# Patient Record
Sex: Female | Born: 1990 | Race: White | Hispanic: No | Marital: Single | Smoking: Former smoker
Health system: Southern US, Community
[De-identification: ages and names within clinical notes are randomized; demographics above are authoritative.]

## PROBLEM LIST (undated history)

## (undated) ENCOUNTER — Inpatient Hospital Stay (HOSPITAL_COMMUNITY): Payer: Self-pay

## (undated) DIAGNOSIS — N83209 Unspecified ovarian cyst, unspecified side: Secondary | ICD-10-CM

## (undated) DIAGNOSIS — O99119 Other diseases of the blood and blood-forming organs and certain disorders involving the immune mechanism complicating pregnancy, unspecified trimester: Secondary | ICD-10-CM

## (undated) DIAGNOSIS — J45909 Unspecified asthma, uncomplicated: Secondary | ICD-10-CM

## (undated) DIAGNOSIS — D696 Thrombocytopenia, unspecified: Secondary | ICD-10-CM

## (undated) DIAGNOSIS — J40 Bronchitis, not specified as acute or chronic: Secondary | ICD-10-CM

## (undated) HISTORY — DX: Other diseases of the blood and blood-forming organs and certain disorders involving the immune mechanism complicating pregnancy, unspecified trimester: O99.119

## (undated) HISTORY — PX: OTHER SURGICAL HISTORY: SHX169

## (undated) HISTORY — DX: Thrombocytopenia, unspecified: D69.6

## (undated) HISTORY — DX: Unspecified asthma, uncomplicated: J45.909

## (undated) HISTORY — PX: APPENDECTOMY: SHX54

## (undated) HISTORY — PX: TONSILLECTOMY AND ADENOIDECTOMY: SUR1326

## (undated) HISTORY — PX: SINUS SURGERY WITH INSTATRAK: SHX5215

---
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
## 2012-01-08 ENCOUNTER — Other Ambulatory Visit: Payer: Self-pay | Admitting: *Deleted

## 2012-01-14 ENCOUNTER — Inpatient Hospital Stay (HOSPITAL_COMMUNITY)
Admission: AD | Admit: 2012-01-14 | Discharge: 2012-01-14 | Disposition: A | Discharge status: Home / Self Care | Payer: Medicaid Other | Source: Ambulatory Visit | Attending: Obstetrics | Admitting: Obstetrics

## 2012-01-14 DIAGNOSIS — Z2989 Encounter for other specified prophylactic measures: Secondary | ICD-10-CM | POA: Insufficient documentation

## 2012-01-14 DIAGNOSIS — Z3480 Encounter for supervision of other normal pregnancy, unspecified trimester: Secondary | ICD-10-CM | POA: Insufficient documentation

## 2012-01-14 DIAGNOSIS — Z298 Encounter for other specified prophylactic measures: Secondary | ICD-10-CM | POA: Insufficient documentation

## 2012-01-14 MED ORDER — RHO D IMMUNE GLOBULIN 1500 UNIT/2ML IJ SOLN
300.0000 ug | Freq: Once | INTRAMUSCULAR | Status: AC
  Administered 2012-01-14: 300 ug via INTRAMUSCULAR
  Filled 2012-01-14: qty 2

## 2012-01-14 NOTE — [facility]U Note (Signed)
Pt presents for rhogam injection at [redacted] weeks pregnant, denies problems

## 2012-01-14 NOTE — MAU Note (Signed)
Call to Dr Clearance Coots for order or rhogam, none in held orders, same obtained.

## 2012-01-15 LAB — RH IG WORKUP (INCLUDES ABO/RH)
Antibody Screen: NEGATIVE
Fetal Screen: NEGATIVE
Unit division: 0

## 2012-02-11 ENCOUNTER — Other Ambulatory Visit: Payer: Self-pay | Admitting: Obstetrics

## 2012-02-11 DIAGNOSIS — D696 Thrombocytopenia, unspecified: Secondary | ICD-10-CM

## 2012-02-11 DIAGNOSIS — O99119 Other diseases of the blood and blood-forming organs and certain disorders involving the immune mechanism complicating pregnancy, unspecified trimester: Secondary | ICD-10-CM

## 2012-02-16 IMAGING — CT CT HEAD W/O CM
1 of 2 series · 16 of 30 positions shown, 20 images · non-contrast
Comparison: None.

CLINICAL DATA: Pain, trauma

CT HEAD WITHOUT CONTRAST
TECHNIQUE: Contiguous axial images were obtained from the base of
the skull through the vertex without contrast.

[Series 3: recon 2: brain · axial · 0.47mm/px · z∈[+145,+280]mm · 16 of 56 slices shown, 20 images]
[im 3/56  brain]
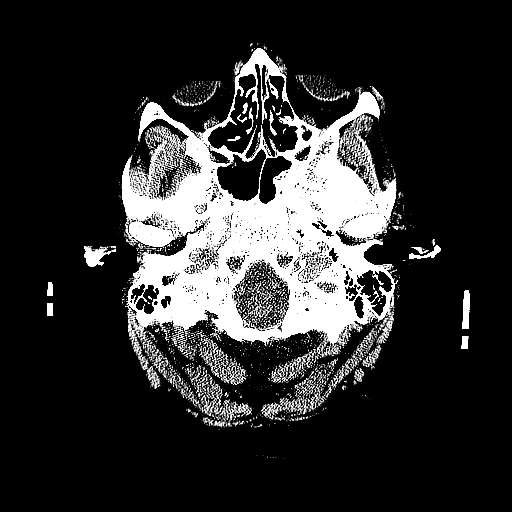
[im 3/56  bone]
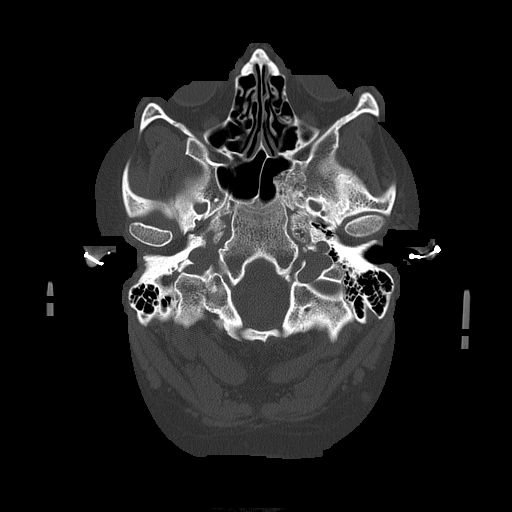
[im 6/56  brain]
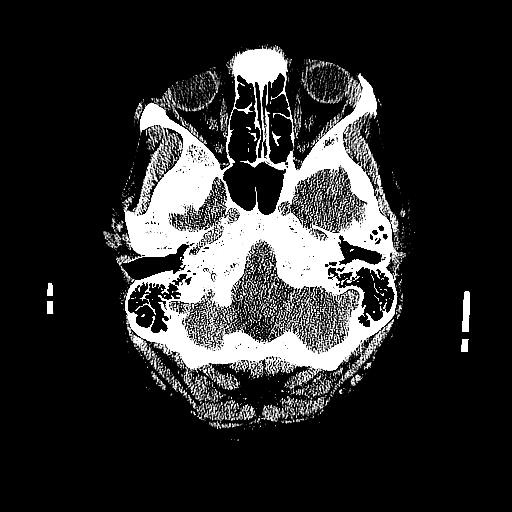
[im 9/56  brain]
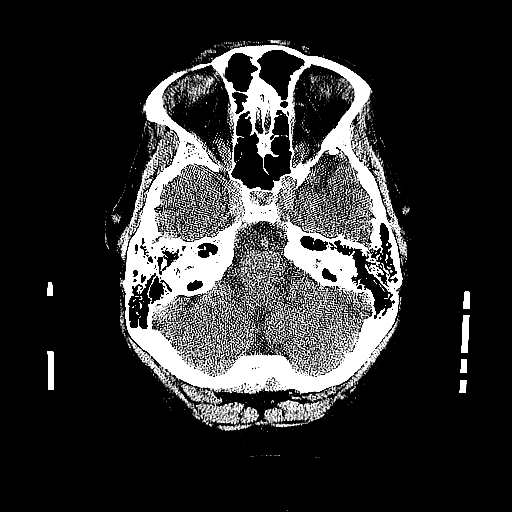
[im 12/56  brain]
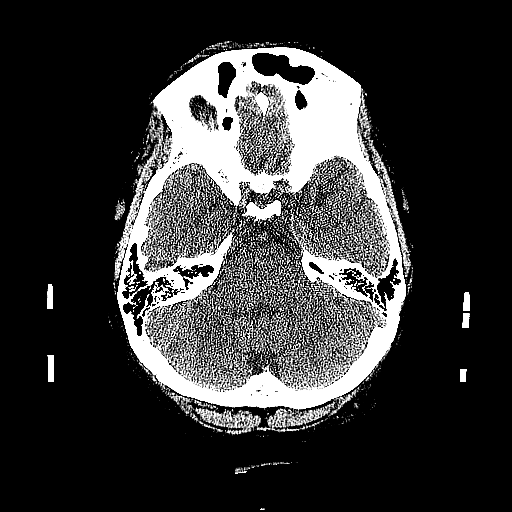
[im 18/56  brain]
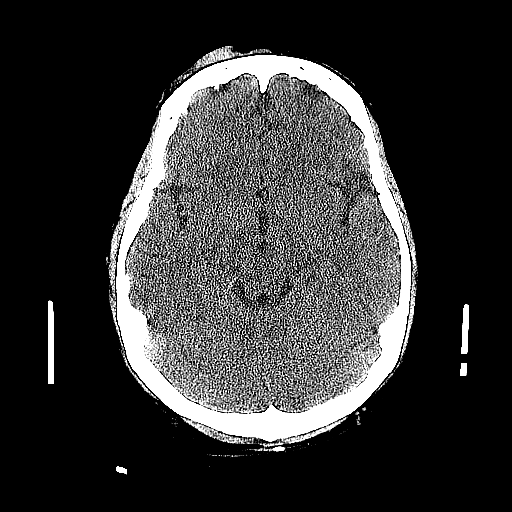
[im 18/56  bone]
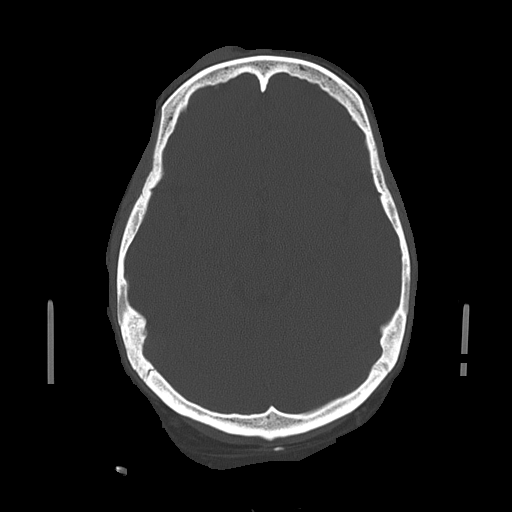
[im 21/56  brain]
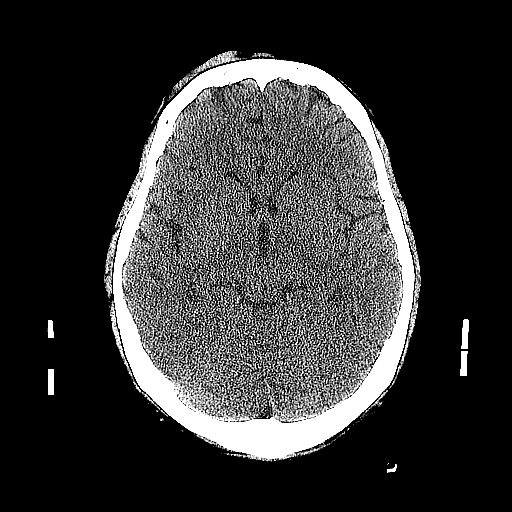
[im 24/56  brain]
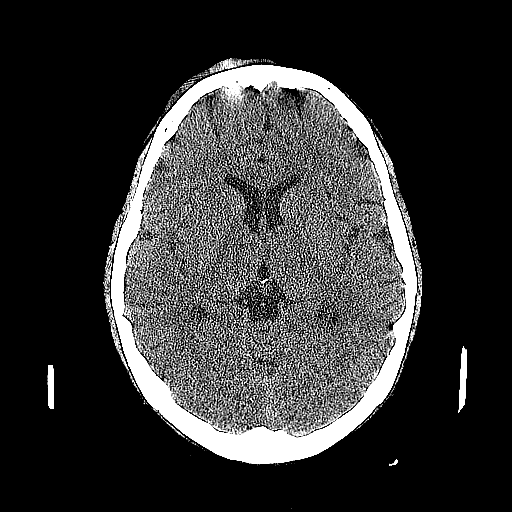
[im 27/56  brain]
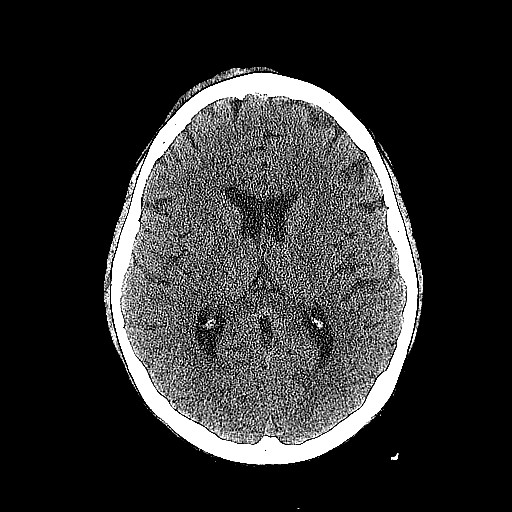
[im 29/56  brain]
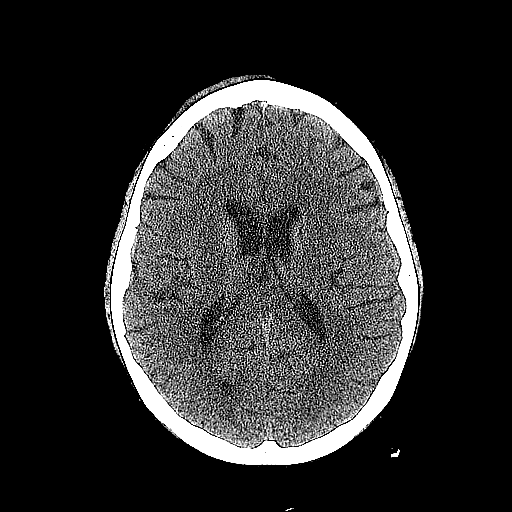
[im 29/56  bone]
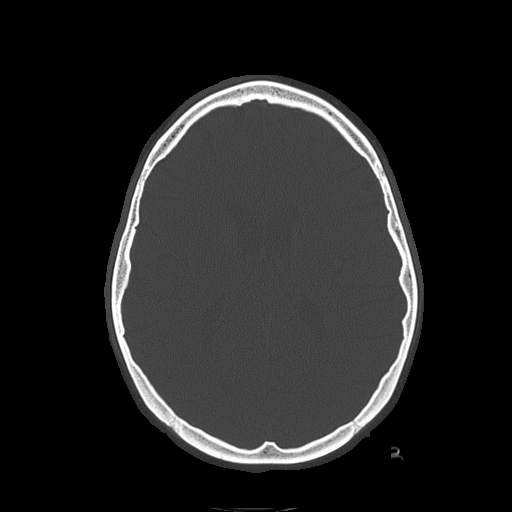
[im 32/56  brain]
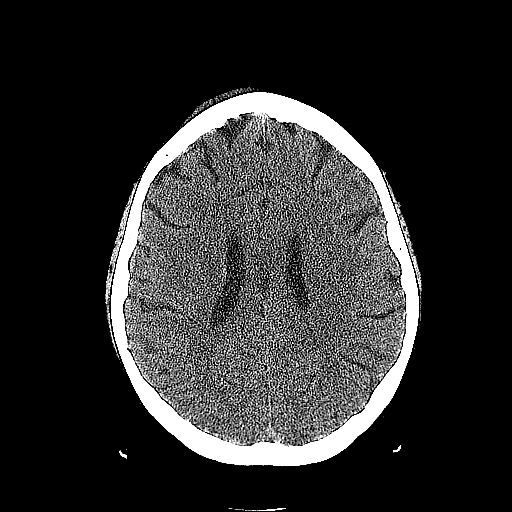
[im 35/56  brain]
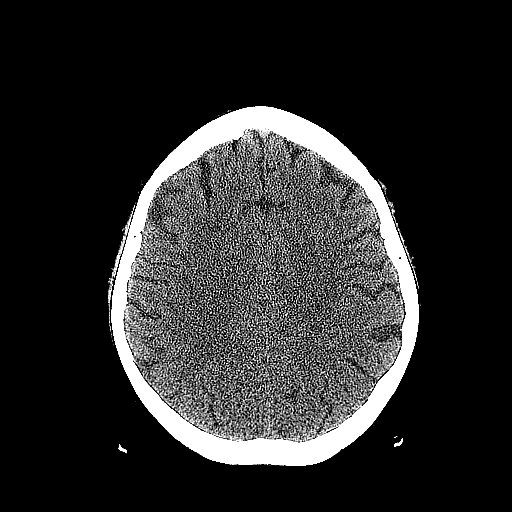
[im 38/56  brain]
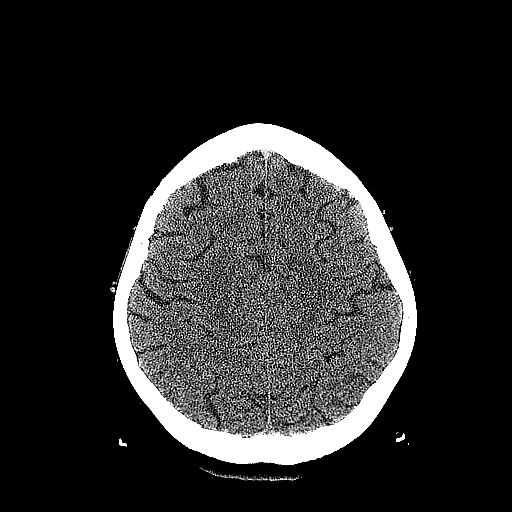
[im 44/56  brain]
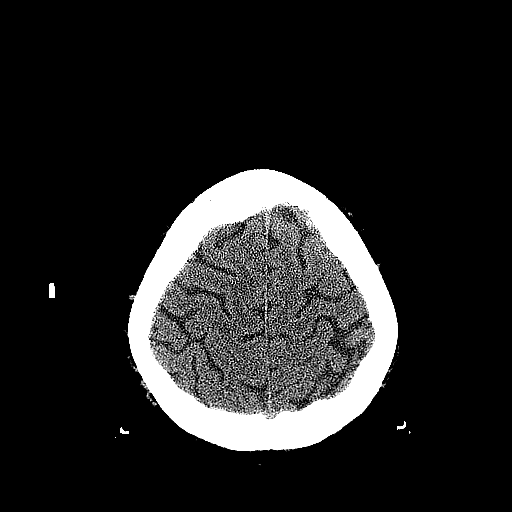
[im 44/56  bone]
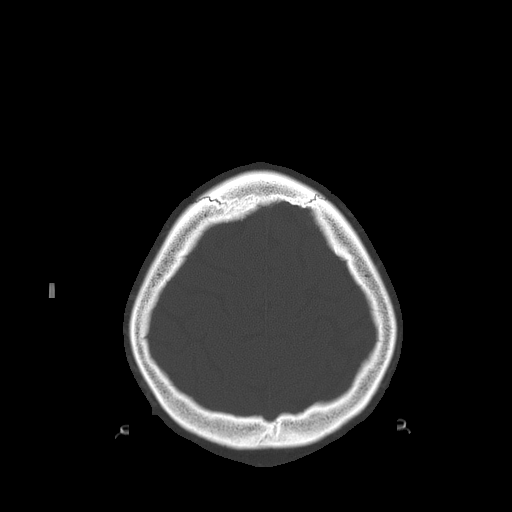
[im 47/56  brain]
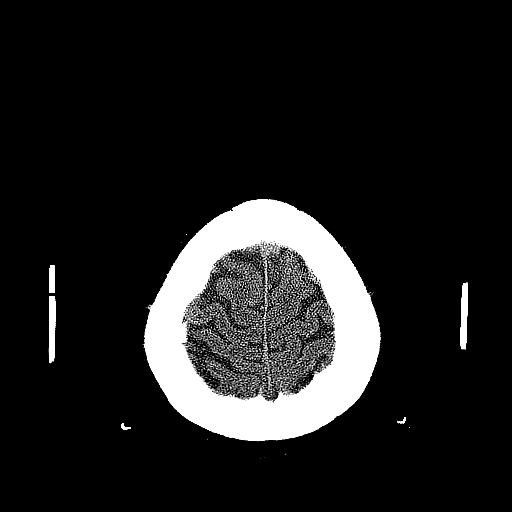
[im 50/56  brain]
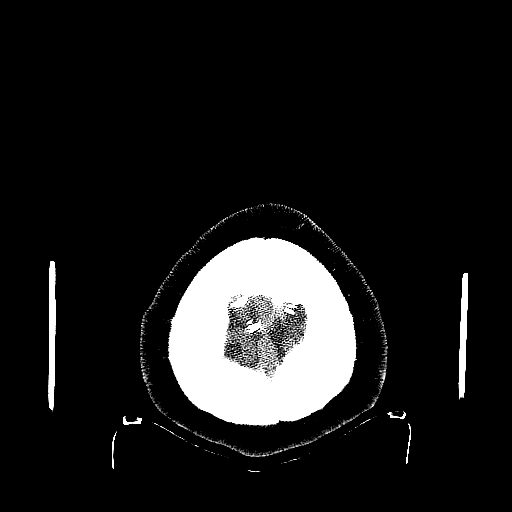
[im 53/56  brain]
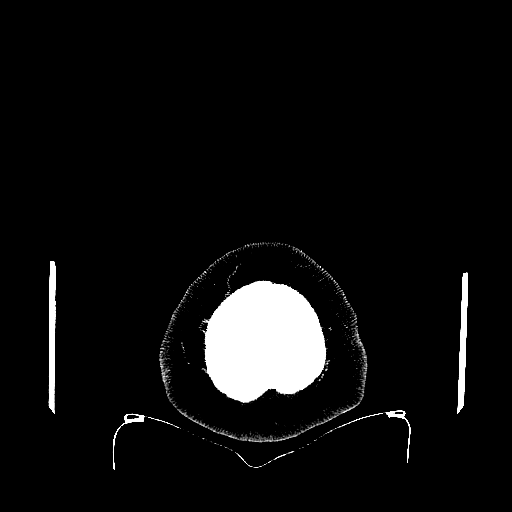

[16 of 30 positions shown; findings below may reference images not displayed]

FINDINGS: There is  right frontal scalp swelling.  No underlying
skull fracture is present.  The orbits and paranasal sinuses are
unremarkable.

The ventricles are normal in size, shape, and position.  There is
no mass effect or midline shift. There is no acute hemorrhage or
abnormal extra-axial fluid collections.  The gray/white
differentiation is normal.
IMPRESSION: There is no evidence of acute intracranial abnormality.

Soft tissue swelling of the inferior right frontal scalp.

## 2012-02-19 ENCOUNTER — Ambulatory Visit (HOSPITAL_COMMUNITY)
Admission: RE | Admit: 2012-02-19 | Discharge: 2012-02-19 | Disposition: A | Discharge status: Home / Self Care | Payer: Medicaid Other | Source: Ambulatory Visit | Attending: Obstetrics | Admitting: Obstetrics

## 2012-02-19 ENCOUNTER — Encounter (HOSPITAL_COMMUNITY): Payer: Self-pay

## 2012-02-19 DIAGNOSIS — D696 Thrombocytopenia, unspecified: Secondary | ICD-10-CM

## 2012-02-19 DIAGNOSIS — O358XX0 Maternal care for other (suspected) fetal abnormality and damage, not applicable or unspecified: Secondary | ICD-10-CM | POA: Insufficient documentation

## 2012-02-19 DIAGNOSIS — Z1389 Encounter for screening for other disorder: Secondary | ICD-10-CM | POA: Insufficient documentation

## 2012-02-19 DIAGNOSIS — Z363 Encounter for antenatal screening for malformations: Secondary | ICD-10-CM | POA: Insufficient documentation

## 2012-02-19 DIAGNOSIS — D689 Coagulation defect, unspecified: Secondary | ICD-10-CM | POA: Insufficient documentation

## 2012-02-21 NOTE — Progress Notes (Signed)
Sylvia Chang is a 21 year old G1 Hispanic female at 33+ weeks who presents for consultation for thrombocytopenia. Sylvia Chang reports no prenatal complications until now except for some spotting in the first trimester. Blood work from 07/15 revealed her platelets to be 92K. She denies any history of low platelets, easy bruising, petechia or bleeding gums.   Ultrasound today: EFW at the 44th %tile, normal AFV and no gross abnormalities identified  Medical history: none Surgical history: ear tubes, T&A and appendectomy Medications: PNVs and "heart burn" pills prn Allergies: none Social: works at [employer]; denies cig/ETOH/drug abuse Family history: noncontributory for congenital anomalies or inheritable disorders  Assessment: 1) IUP at 33+3 weeks 2) Most likely gestational thrombocytopenia - she has no concurrent medical disorders and is not taking any medications known to cause thrombocytopenia  The implications of a low platelet count in pregnancy were discussed in detail. We reviewed the differences between gestational thrombocytopenia and immune thrombocytopenia including neonatal concerns. The most important issue at this point in the pregnancy is for her to have an option for regional anesthesia; therefore, her platelets need to be > 100K.  Recommendations: 1) Serial platelet counts - she will return on 08/05 for a repeat platelet count 2) If her count is < 100K at 37-38 weeks, will try a short course of prednisone  3) If her count is < 70K, will consider further work-up for possible immune thrombocytopenia  It was a pleasure meeting Sylvia Chang and looking forward to seeing her again.  (Face-to-face consultation with patient: 30 min)

## 2012-03-02 ENCOUNTER — Ambulatory Visit (HOSPITAL_COMMUNITY)
Admission: RE | Admit: 2012-03-02 | Discharge: 2012-03-02 | Disposition: A | Discharge status: Home / Self Care | Payer: Medicaid Other | Source: Ambulatory Visit | Attending: Obstetrics | Admitting: Obstetrics

## 2012-03-02 DIAGNOSIS — D689 Coagulation defect, unspecified: Secondary | ICD-10-CM | POA: Insufficient documentation

## 2012-03-02 DIAGNOSIS — D696 Thrombocytopenia, unspecified: Secondary | ICD-10-CM | POA: Insufficient documentation

## 2012-04-09 ENCOUNTER — Other Ambulatory Visit: Payer: Self-pay | Admitting: Obstetrics

## 2012-04-10 ENCOUNTER — Encounter (HOSPITAL_COMMUNITY): Payer: Self-pay | Admitting: *Deleted

## 2012-04-10 ENCOUNTER — Telehealth (HOSPITAL_COMMUNITY): Payer: Self-pay | Admitting: *Deleted

## 2012-04-10 NOTE — Telephone Encounter (Signed)
Preadmission screen  

## 2012-04-12 ENCOUNTER — Encounter (HOSPITAL_COMMUNITY): Payer: Self-pay | Admitting: Anesthesiology

## 2012-04-12 ENCOUNTER — Inpatient Hospital Stay (HOSPITAL_COMMUNITY): Payer: Medicaid Other | Admitting: Anesthesiology

## 2012-04-12 ENCOUNTER — Encounter (HOSPITAL_COMMUNITY): Payer: Self-pay

## 2012-04-12 ENCOUNTER — Inpatient Hospital Stay (HOSPITAL_COMMUNITY)
Admission: RE | Admit: 2012-04-12 | Discharge: 2012-04-15 | DRG: 775 | Disposition: A | Discharge status: Home / Self Care | Payer: Medicaid Other | Source: Ambulatory Visit | Attending: Obstetrics | Admitting: Obstetrics

## 2012-04-12 DIAGNOSIS — O9903 Anemia complicating the puerperium: Secondary | ICD-10-CM | POA: Diagnosis not present

## 2012-04-12 DIAGNOSIS — Z22330 Carrier of Group B streptococcus: Secondary | ICD-10-CM

## 2012-04-12 DIAGNOSIS — D649 Anemia, unspecified: Secondary | ICD-10-CM | POA: Diagnosis not present

## 2012-04-12 DIAGNOSIS — O99892 Other specified diseases and conditions complicating childbirth: Secondary | ICD-10-CM | POA: Diagnosis present

## 2012-04-12 DIAGNOSIS — O480 Post-term pregnancy: Principal | ICD-10-CM | POA: Diagnosis present

## 2012-04-12 DIAGNOSIS — IMO0001 Reserved for inherently not codable concepts without codable children: Secondary | ICD-10-CM

## 2012-04-12 DIAGNOSIS — D696 Thrombocytopenia, unspecified: Secondary | ICD-10-CM | POA: Diagnosis present

## 2012-04-12 DIAGNOSIS — D689 Coagulation defect, unspecified: Secondary | ICD-10-CM | POA: Diagnosis present

## 2012-04-12 LAB — CBC
Hemoglobin: 12.9 g/dL (ref 12.0–15.0)
MCH: 29.5 pg (ref 26.0–34.0)
MCHC: 33.8 g/dL (ref 30.0–36.0)
MCV: 87.3 fL (ref 78.0–100.0)
Platelets: 97 10*3/uL — ABNORMAL LOW (ref 150–400)
Platelets: 108 10*3/uL — ABNORMAL LOW (ref 150–400)
RBC: 4.41 MIL/uL (ref 3.87–5.11)
RBC: 4.36 MIL/uL (ref 3.87–5.11)
WBC: 10.8 10*3/uL — ABNORMAL HIGH (ref 4.0–10.5)

## 2012-04-12 MED ORDER — LACTATED RINGERS IV SOLN
500.0000 mL | Freq: Once | INTRAVENOUS | Status: AC
  Administered 2012-04-13: 500 mL via INTRAVENOUS

## 2012-04-12 MED ORDER — DIPHENHYDRAMINE HCL 50 MG/ML IJ SOLN: 12.5000 mg | INTRAMUSCULAR | Status: DC | PRN

## 2012-04-12 MED ORDER — PHENYLEPHRINE 40 MCG/ML (10ML) SYRINGE FOR IV PUSH (FOR BLOOD PRESSURE SUPPORT): 80.0000 ug | PREFILLED_SYRINGE | INTRAVENOUS | Status: DC | PRN

## 2012-04-12 MED ORDER — OXYTOCIN 40 UNITS IN LACTATED RINGERS INFUSION - SIMPLE MED
1.0000 m[IU]/min | INTRAVENOUS | Status: DC
  Administered 2012-04-12: 1 m[IU]/min via INTRAVENOUS

## 2012-04-12 MED ORDER — IBUPROFEN 600 MG PO TABS: 600.0000 mg | ORAL_TABLET | Freq: Four times a day (QID) | ORAL | Status: DC | PRN

## 2012-04-12 MED ORDER — EPHEDRINE 5 MG/ML INJ
10.0000 mg | INTRAVENOUS | Status: AC | PRN
  Administered 2012-04-13 (×2): 10 mg via INTRAVENOUS
  Filled 2012-04-12 (×2): qty 4

## 2012-04-12 MED ORDER — OXYTOCIN 40 UNITS IN LACTATED RINGERS INFUSION - SIMPLE MED
1.0000 m[IU]/min | INTRAVENOUS | Status: DC
  Filled 2012-04-12: qty 1000

## 2012-04-12 MED ORDER — LACTATED RINGERS IV SOLN
INTRAVENOUS | Status: DC
  Administered 2012-04-12 (×3): via INTRAVENOUS

## 2012-04-12 MED ORDER — PROMETHAZINE HCL 25 MG/ML IJ SOLN
25.0000 mg | Freq: Four times a day (QID) | INTRAMUSCULAR | Status: DC | PRN
  Administered 2012-04-12: 25 mg via INTRAMUSCULAR
  Filled 2012-04-12: qty 1

## 2012-04-12 MED ORDER — LIDOCAINE HCL (PF) 1 % IJ SOLN
30.0000 mL | INTRAMUSCULAR | Status: AC | PRN
  Administered 2012-04-13: 30 mL via SUBCUTANEOUS
  Filled 2012-04-12: qty 30

## 2012-04-12 MED ORDER — OXYCODONE-ACETAMINOPHEN 5-325 MG PO TABS: 1.0000 | ORAL_TABLET | ORAL | Status: DC | PRN

## 2012-04-12 MED ORDER — NALBUPHINE SYRINGE 5 MG/0.5 ML
10.0000 mg | INJECTION | Freq: Four times a day (QID) | INTRAMUSCULAR | Status: DC | PRN
  Administered 2012-04-12: 10 mg via INTRAMUSCULAR
  Filled 2012-04-12 (×2): qty 1

## 2012-04-12 MED ORDER — CITRIC ACID-SODIUM CITRATE 334-500 MG/5ML PO SOLN: 30.0000 mL | ORAL | Status: DC | PRN

## 2012-04-12 MED ORDER — TERBUTALINE SULFATE 1 MG/ML IJ SOLN: 0.2500 mg | Freq: Once | INTRAMUSCULAR | Status: AC | PRN

## 2012-04-12 MED ORDER — LACTATED RINGERS IV SOLN: 500.0000 mL | INTRAVENOUS | Status: DC | PRN

## 2012-04-12 MED ORDER — OXYTOCIN BOLUS FROM INFUSION
500.0000 mL | Freq: Once | INTRAVENOUS | Status: DC
  Filled 2012-04-12: qty 500

## 2012-04-12 MED ORDER — PENICILLIN G POTASSIUM 5000000 UNITS IJ SOLR
5.0000 10*6.[IU] | Freq: Once | INTRAVENOUS | Status: AC
  Administered 2012-04-12: 5 10*6.[IU] via INTRAVENOUS
  Filled 2012-04-12: qty 5

## 2012-04-12 MED ORDER — ACETAMINOPHEN 325 MG PO TABS: 650.0000 mg | ORAL_TABLET | ORAL | Status: DC | PRN

## 2012-04-12 MED ORDER — PENICILLIN G POTASSIUM 5000000 UNITS IJ SOLR
2.5000 10*6.[IU] | INTRAVENOUS | Status: DC
  Administered 2012-04-12 – 2012-04-13 (×5): 2.5 10*6.[IU] via INTRAVENOUS
  Filled 2012-04-12 (×8): qty 2.5

## 2012-04-12 MED ORDER — NALBUPHINE SYRINGE 5 MG/0.5 ML
10.0000 mg | INJECTION | INTRAMUSCULAR | Status: DC | PRN
  Administered 2012-04-12 (×2): 10 mg via INTRAVENOUS
  Filled 2012-04-12 (×3): qty 1

## 2012-04-12 MED ORDER — PHENYLEPHRINE 40 MCG/ML (10ML) SYRINGE FOR IV PUSH (FOR BLOOD PRESSURE SUPPORT)
80.0000 ug | PREFILLED_SYRINGE | INTRAVENOUS | Status: DC | PRN
  Filled 2012-04-12: qty 5

## 2012-04-12 MED ORDER — OXYTOCIN 40 UNITS IN LACTATED RINGERS INFUSION - SIMPLE MED
62.5000 mL/h | Freq: Once | INTRAVENOUS | Status: AC
  Administered 2012-04-13: 62.5 mL/h via INTRAVENOUS

## 2012-04-12 MED ORDER — ONDANSETRON HCL 4 MG/2ML IJ SOLN: 4.0000 mg | Freq: Four times a day (QID) | INTRAMUSCULAR | Status: DC | PRN

## 2012-04-12 MED ORDER — FENTANYL 2.5 MCG/ML BUPIVACAINE 1/10 % EPIDURAL INFUSION (WH - ANES)
14.0000 mL/h | INTRAMUSCULAR | Status: DC
  Administered 2012-04-12: 14 mL/h via EPIDURAL
  Administered 2012-04-12: 5 mL/h via EPIDURAL
  Administered 2012-04-12 – 2012-04-13 (×4): 14 mL/h via EPIDURAL
  Filled 2012-04-12 (×5): qty 60

## 2012-04-12 MED ORDER — OXYTOCIN 40 UNITS IN LACTATED RINGERS INFUSION - SIMPLE MED
INTRAVENOUS | Status: AC
  Administered 2012-04-12: 1 m[IU]/min via INTRAVENOUS
  Filled 2012-04-12: qty 1000

## 2012-04-12 MED ORDER — EPHEDRINE 5 MG/ML INJ: 10.0000 mg | INTRAVENOUS | Status: DC | PRN

## 2012-04-12 NOTE — Progress Notes (Signed)
Sylvia Chang is a 21 y.o. G1P0 at [redacted]w[redacted]d by LMP admitted for induction of labor due to Post dates. Due date 04-05-12.  Subjective:   Objective: BP 123/71  Pulse 90  Temp 98.6 F (37 C) (Oral)  Resp 18  Ht 5\' 3"  (1.6 m)  Wt 127.461 kg (281 lb)  BMI 49.78 kg/m2  LMP 06/19/2011      FHT:  FHR: 150 bpm, variability: moderate,  accelerations:  Present,  decelerations:  Absent UC:   irregular, every 4-7 minutes SVE:   Dilation: 4 Effacement (%): 70 Station: -2 Exam by:: Rzhang,rnc-ob  Labs: Lab Results  Component Value Date   WBC 9.6 04/12/2012   HGB 13.0 04/12/2012   HCT 38.5 04/12/2012   MCV 87.3 04/12/2012   PLT 97* 04/12/2012    Assessment / Plan: Induction of labor due to postterm,  progressing well on pitocin  Labor: Latent phase Preeclampsia:  n/a Fetal Wellbeing:  Category I Pain Control:  Labor support without medications I/D:  n/a Anticipated MOD:  NSVD  Ark Agrusa A 04/12/2012, 9:24 AM

## 2012-04-12 NOTE — Progress Notes (Signed)
0830 DR HARPER CALLED AND UPDATED ON PT ARRIVED FOR IOL, SVE, LAST 2 BP'S TAKEN, GBS +. ORDERS RECEIVED. NOTIFIED FHR NOT REACTIVE INITIALLY, NOW REACTIVE.

## 2012-04-12 NOTE — Anesthesia Preprocedure Evaluation (Signed)
Anesthesia Evaluation  Patient identified by MRN, date of birth, ID band Patient awake    Reviewed: Allergy & Precautions, H&P , Patient's Chart, lab work & pertinent test results  Airway Mallampati: IV TM Distance: >3 FB Neck ROM: full    Dental No notable dental hx.    Pulmonary neg pulmonary ROS, asthma ,  breath sounds clear to auscultation  Pulmonary exam normal       Cardiovascular hypertension, negative cardio ROS  Rhythm:regular Rate:Normal     Neuro/Psych negative neurological ROS  negative psych ROS   GI/Hepatic negative GI ROS, Neg liver ROS,   Endo/Other  negative endocrine ROSMorbid obesity  Renal/GU negative Renal ROS     Musculoskeletal   Abdominal   Peds  Hematology negative hematology ROS (+)   Anesthesia Other Findings   Reproductive/Obstetrics (+) Pregnancy                           Anesthesia Physical Anesthesia Plan  ASA: III  Anesthesia Plan: Epidural   Post-op Pain Management:    Induction:   Airway Management Planned:   Additional Equipment:   Intra-op Plan:   Post-operative Plan:   Informed Consent: I have reviewed the patients History and Physical, chart, labs and discussed the procedure including the risks, benefits and alternatives for the proposed anesthesia with the patient or authorized representative who has indicated his/her understanding and acceptance.     Plan Discussed with:   Anesthesia Plan Comments:         Anesthesia Quick Evaluation

## 2012-04-12 NOTE — Anesthesia Procedure Notes (Signed)
Epidural Patient location during procedure: OB Start time: 04/12/2012 7:47 PM  Staffing Anesthesiologist: Brayton Caves R Performed by: anesthesiologist   Preanesthetic Checklist Completed: patient identified, site marked, surgical consent, pre-op evaluation, timeout performed, IV checked, risks and benefits discussed and monitors and equipment checked  Epidural Patient position: sitting Prep: site prepped and draped and DuraPrep Patient monitoring: continuous pulse ox and blood pressure Approach: midline Injection technique: LOR air and LOR saline  Needle:  Needle type: Tuohy  Needle gauge: 17 G Needle length: 9 cm and 9 Needle insertion depth: 7 cm Catheter type: closed end flexible Catheter size: 19 Gauge Catheter at skin depth: 14 cm Test dose: negative  Assessment Events: blood not aspirated, injection not painful, no injection resistance, negative IV test and no paresthesia  Additional Notes Patient identified.  Risk benefits discussed including failed block, incomplete pain control, headache, nerve damage, paralysis, blood pressure changes, nausea, vomiting, reactions to medication both toxic or allergic, and postpartum back pain.  Patient expressed understanding and wished to proceed.  All questions were answered.  Sterile technique used throughout procedure and epidural site dressed with sterile barrier dressing. No paresthesia or other complications noted.The patient did not experience any signs of intravascular injection such as tinnitus or metallic taste in mouth nor signs of intrathecal spread such as rapid motor block. Please see nursing notes for vital signs.

## 2012-04-12 NOTE — H&P (Signed)
Sylvia Chang is a 21 y.o. female presenting for IOL. Maternal Medical History:  Reason for admission: 21 yo G1  EDC 04-05-12.  Presents for IOL for postdates.  Pregnancy complicated by thrombocytopenia.  Fetal activity: Perceived fetal activity is normal.   Last perceived fetal movement was within the past hour.    Prenatal complications: Thrombocytopenia.   Prenatal Complications - Diabetes: none.    OB History    Grav Para Term Preterm Abortions TAB SAB Ect Mult Living   1              Past Medical History  Diagnosis Date  . Bronchitis   . Ovarian cyst   . Asthma   . Thrombocytopenia complicating pregnancy    Past Surgical History  Procedure Date  . Sinus surgery with instatrak   . Tonsillectomy and adenoidectomy   . Tubes in ears   . Appendectomy    Family History: family history includes Diabetes in her father, paternal grandfather, and paternal uncle; Heart disease in her mother; and Hypertension in her mother. Social History:  reports that she quit smoking about 9 months ago. Her smoking use included Cigarettes. She has never used smokeless tobacco. She reports that she uses illicit drugs (Marijuana). She reports that she does not drink alcohol.   Prenatal Transfer Tool  Maternal Diabetes: No Genetic Screening: Normal Maternal Ultrasounds/Referrals: Normal Fetal Ultrasounds or other Referrals:  None Maternal Substance Abuse:  No Significant Maternal Medications:  Meds include: Other:  Significant Maternal Lab Results:  Lab values include: Group B Strep positive Other Comments:  None  Review of Systems  All other systems reviewed and are negative.    Dilation: 4 Effacement (%): 70 Station: -2 Exam by:: Rzhang,rnc-ob Blood pressure 123/71, pulse 90, temperature 98.6 F (37 C), temperature source Oral, resp. rate 18, height 5\' 3"  (1.6 m), weight 127.461 kg (281 lb), last menstrual period 06/19/2011. Maternal Exam:  Uterine Assessment: Contraction  strength is mild.  Abdomen: Patient reports no abdominal tenderness. Fetal presentation: vertex  Introitus: Normal vulva. Normal vagina.  Pelvis: adequate for delivery.   Cervix: Cervix evaluated by digital exam.     Physical Exam  Nursing note and vitals reviewed. Constitutional: She is oriented to person, place, and time. She appears well-developed and well-nourished.  HENT:  Head: Normocephalic and atraumatic.  Eyes: Conjunctivae normal are normal. Pupils are equal, round, and reactive to light.  Neck: Normal range of motion. Neck supple.  Cardiovascular: Normal rate and regular rhythm.   Respiratory: Effort normal and breath sounds normal.  GI: Soft.  Genitourinary: Vagina normal and uterus normal.  Musculoskeletal: Normal range of motion.  Neurological: She is alert and oriented to person, place, and time.  Skin: Skin is warm and dry.  Psychiatric: She has a normal mood and affect. Her behavior is normal. Judgment and thought content normal.    Prenatal labs: ABO, Rh: --/--/A NEG (06/18 2118) Antibody: NEG (06/18 2118) Rubella: Immune (03/12 0000) RPR: Nonreactive (03/12 0000)  HBsAg: NEGATIVE (11/21 1253)  HIV: Non-reactive (03/12 0000)  GBS: Positive (08/15 0000)   Assessment/Plan: 41 weeks.  IOL.  H/O Gestational Thrombocytopenia.  Low dose pitocin per protocol.   Sylvia Chang A 04/12/2012, 9:12 AM

## 2012-04-12 NOTE — Progress Notes (Signed)
Based on SVE-pt wants to try to walk before getting iv pain med.

## 2012-04-12 NOTE — Plan of Care (Signed)
Problem: Consults Goal: Birthing Suites Patient Information Press F2 to bring up selections list  Outcome: Completed/Met Date Met:  04/12/12  Pt > [redacted] weeks EGA and Inpatient induction

## 2012-04-13 ENCOUNTER — Other Ambulatory Visit: Payer: Self-pay

## 2012-04-13 ENCOUNTER — Inpatient Hospital Stay (HOSPITAL_COMMUNITY): Payer: Medicaid Other

## 2012-04-13 ENCOUNTER — Encounter (HOSPITAL_COMMUNITY): Payer: Self-pay

## 2012-04-13 DIAGNOSIS — IMO0001 Reserved for inherently not codable concepts without codable children: Secondary | ICD-10-CM

## 2012-04-13 DIAGNOSIS — D696 Thrombocytopenia, unspecified: Secondary | ICD-10-CM | POA: Diagnosis present

## 2012-04-13 DIAGNOSIS — O480 Post-term pregnancy: Secondary | ICD-10-CM | POA: Diagnosis present

## 2012-04-13 LAB — CBC
Hemoglobin: 9.9 g/dL — ABNORMAL LOW (ref 12.0–15.0)
MCH: 30.5 pg (ref 26.0–34.0)
MCV: 89.2 fL (ref 78.0–100.0)
Platelets: 119 10*3/uL — ABNORMAL LOW (ref 150–400)
RBC: 3.25 MIL/uL — ABNORMAL LOW (ref 3.87–5.11)
WBC: 16.1 10*3/uL — ABNORMAL HIGH (ref 4.0–10.5)

## 2012-04-13 MED ORDER — SENNOSIDES-DOCUSATE SODIUM 8.6-50 MG PO TABS
2.0000 | ORAL_TABLET | Freq: Every day | ORAL | Status: DC
  Administered 2012-04-13 – 2012-04-14 (×2): 2 via ORAL

## 2012-04-13 MED ORDER — MEASLES, MUMPS & RUBELLA VAC ~~LOC~~ INJ: 0.5000 mL | INJECTION | Freq: Once | SUBCUTANEOUS | Status: DC

## 2012-04-13 MED ORDER — DIPHENHYDRAMINE HCL 25 MG PO CAPS: 25.0000 mg | ORAL_CAPSULE | Freq: Four times a day (QID) | ORAL | Status: DC | PRN

## 2012-04-13 MED ORDER — ONDANSETRON HCL 4 MG PO TABS: 4.0000 mg | ORAL_TABLET | ORAL | Status: DC | PRN

## 2012-04-13 MED ORDER — OXYCODONE-ACETAMINOPHEN 5-325 MG PO TABS
1.0000 | ORAL_TABLET | ORAL | Status: DC | PRN
  Administered 2012-04-13 (×2): 1 via ORAL
  Administered 2012-04-14 (×5): 2 via ORAL
  Administered 2012-04-15: 1 via ORAL
  Administered 2012-04-15 (×2): 2 via ORAL
  Filled 2012-04-13 (×3): qty 2
  Filled 2012-04-13 (×3): qty 1
  Filled 2012-04-13: qty 2
  Filled 2012-04-13: qty 1
  Filled 2012-04-13: qty 2
  Filled 2012-04-13: qty 1
  Filled 2012-04-13: qty 2

## 2012-04-13 MED ORDER — BENZOCAINE-MENTHOL 20-0.5 % EX AERO
1.0000 "application " | INHALATION_SPRAY | CUTANEOUS | Status: DC | PRN
  Administered 2012-04-14: 1 via TOPICAL
  Filled 2012-04-13: qty 56

## 2012-04-13 MED ORDER — LANOLIN HYDROUS EX OINT: TOPICAL_OINTMENT | CUTANEOUS | Status: DC | PRN

## 2012-04-13 MED ORDER — PRENATAL MULTIVITAMIN CH
1.0000 | ORAL_TABLET | Freq: Every day | ORAL | Status: DC
  Administered 2012-04-13 – 2012-04-15 (×3): 1 via ORAL
  Filled 2012-04-13 (×3): qty 1

## 2012-04-13 MED ORDER — ONDANSETRON HCL 4 MG/2ML IJ SOLN: 4.0000 mg | INTRAMUSCULAR | Status: DC | PRN

## 2012-04-13 MED ORDER — WITCH HAZEL-GLYCERIN EX PADS: 1.0000 "application " | MEDICATED_PAD | CUTANEOUS | Status: DC | PRN

## 2012-04-13 MED ORDER — FERROUS SULFATE 325 (65 FE) MG PO TABS
325.0000 mg | ORAL_TABLET | Freq: Two times a day (BID) | ORAL | Status: DC
  Administered 2012-04-13 – 2012-04-15 (×4): 325 mg via ORAL
  Filled 2012-04-13 (×4): qty 1

## 2012-04-13 MED ORDER — BUPIVACAINE HCL (PF) 0.25 % IJ SOLN
INTRAMUSCULAR | Status: DC | PRN
  Administered 2012-04-13 (×2): 5 mL

## 2012-04-13 MED ORDER — MEDROXYPROGESTERONE ACETATE 150 MG/ML IM SUSP: 150.0000 mg | INTRAMUSCULAR | Status: DC | PRN

## 2012-04-13 MED ORDER — TETANUS-DIPHTH-ACELL PERTUSSIS 5-2.5-18.5 LF-MCG/0.5 IM SUSP: 0.5000 mL | Freq: Once | INTRAMUSCULAR | Status: DC

## 2012-04-13 MED ORDER — DIBUCAINE 1 % RE OINT: 1.0000 "application " | TOPICAL_OINTMENT | RECTAL | Status: DC | PRN

## 2012-04-13 MED ORDER — ZOLPIDEM TARTRATE 5 MG PO TABS: 5.0000 mg | ORAL_TABLET | Freq: Every evening | ORAL | Status: DC | PRN

## 2012-04-13 MED ORDER — INFLUENZA VIRUS VACC SPLIT PF IM SUSP
0.5000 mL | INTRAMUSCULAR | Status: AC
  Administered 2012-04-14: 0.5 mL via INTRAMUSCULAR
  Filled 2012-04-13: qty 0.5

## 2012-04-13 MED ORDER — MAGNESIUM HYDROXIDE 400 MG/5ML PO SUSP: 30.0000 mL | ORAL | Status: DC | PRN

## 2012-04-13 NOTE — Progress Notes (Signed)
Assessed by [facility] and K.Jerrell Mylar

## 2012-04-13 NOTE — Progress Notes (Signed)
Dr. Malen Gauze notified of pt's blood pressure, pulse, interventions, gave orders for additional fluid bolus, repeat cbc, leave epidural cath in at this time.

## 2012-04-13 NOTE — Progress Notes (Signed)
RT at bedside, EKG in progress

## 2012-04-13 NOTE — Progress Notes (Signed)
Pt transferred to room 114, holding baby, via stretcher, pt states, "i feel fine", has no pain, IV- LR infusing.

## 2012-04-13 NOTE — Anesthesia Postprocedure Evaluation (Signed)
  Anesthesia Post-op Note  Patient: Sylvia Chang  Procedure(s) Performed: * No surgery found *  Patient Location: Mother/Baby  Anesthesia Type: Epidural  Level of Consciousness: awake  Airway and Oxygen Therapy: Patient Spontanous Breathing  Post-op Pain: none  Post-op Assessment: Patient's Cardiovascular Status Stable, Respiratory Function Stable, Patent Airway, No signs of Nausea or vomiting, Adequate PO intake, Pain level controlled, No headache, No backache, No residual numbness and No residual motor weakness  Post-op Vital Signs: Reviewed and stable  Complications: No apparent anesthesia complications

## 2012-04-13 NOTE — Progress Notes (Signed)
Chest xray results called, may transfer pt to room 114.

## 2012-04-13 NOTE — Progress Notes (Signed)
Notified of pt's blood pressure, cbc results, received orders for EKG, CBG, will continue recovery here.

## 2012-04-13 NOTE — Progress Notes (Signed)
JULIEANNA GERACI is a 21 y.o. G1P0 at [redacted]w[redacted]d by LMP admitted for induction of labor due to Post dates. Due date 04-05-12.  Subjective:   Objective: BP 105/59  Pulse 70  Temp 98.4 F (36.9 C) (Oral)  Resp 20  Ht 5\' 3"  (1.6 m)  Wt 127.461 kg (281 lb)  BMI 49.78 kg/m2  SpO2 99%  LMP 06/19/2011      FHT:  FHR: 150 bpm, variability: moderate,  accelerations:  Present,  decelerations:  Absent UC:   regular, every 3 minutes SVE:   Dilation: 9 Effacement (%): 100 Station: -1 Exam by:: [employer] RN  Labs: Lab Results  Component Value Date   WBC 10.8* 04/12/2012   HGB 12.9 04/12/2012   HCT 38.3 04/12/2012   MCV 87.8 04/12/2012   PLT 108* 04/12/2012    Assessment / Plan: Induction of labor due to postterm,  progressing well on pitocin  Labor: Progressing on Pitocin, will continue to increase then AROM Preeclampsia:  n/a Fetal Wellbeing:  Category I Pain Control:  Epidural I/D:  n/a Anticipated MOD:  NSVD  HARPER,CHARLES A 04/13/2012, 3:30 AM

## 2012-04-13 NOTE — Progress Notes (Signed)
UR chart review completed.  

## 2012-04-13 NOTE — Progress Notes (Signed)
Sylvia Chang is Chang 21 y.o. G1P0 at [redacted]w[redacted]d by LMP admitted for induction of labor due to Post dates.   Subjective: C/O pressure  Objective: BP 142/77  Pulse 85  Temp 98.7 F (37.1 C) (Oral)  Resp 20  Ht 5\' 3"  (1.6 m)  Wt 127.461 kg (281 lb)  BMI 49.78 kg/m2  SpO2 99%  LMP 06/19/2011      FHT:  FHR: 140 bpm, variability: moderate,  accelerations:  Abscent,  decelerations:  Absent UC:   regular, every 2 minutes SVE:   Dilation: 10 Effacement (%): 100 Station: +2 Exam by:: felkel,rn  Labs: Lab Results  Component Value Date   WBC 10.8* 04/12/2012   HGB 12.9 04/12/2012   HCT 38.3 04/12/2012   MCV 87.8 04/12/2012   PLT 108* 04/12/2012    Assessment / Plan: Induction of labor due to postterm,  progressing well on pitocin  Labor: Progressing normally Preeclampsia:  B/Ps borderline Fetal Wellbeing:  Category I Pain Control:  Epidural I/D:  n/Chang Anticipated MOD:  NSVD  [name],Sylvia Chang 04/13/2012, 7:53 AM

## 2012-04-14 LAB — CBC
MCH: 29.7 pg (ref 26.0–34.0)
MCHC: 33.6 g/dL (ref 30.0–36.0)
Platelets: 78 10*3/uL — ABNORMAL LOW (ref 150–400)
RDW: 13.5 % (ref 11.5–15.5)

## 2012-04-14 MED ORDER — RHO D IMMUNE GLOBULIN 1500 UNIT/2ML IJ SOLN
300.0000 ug | Freq: Once | INTRAMUSCULAR | Status: AC
  Administered 2012-04-14: 300 ug via INTRAMUSCULAR
  Filled 2012-04-14: qty 2

## 2012-04-14 NOTE — Progress Notes (Signed)
Post Partum Day 1 Subjective: no complaints  Objective: Blood pressure 98/65, pulse 91, temperature 97.5 F (36.4 C), temperature source Oral, resp. rate 18, height 5\' 3"  (1.6 m), weight 127.461 kg (281 lb), last menstrual period 06/19/2011, SpO2 100.00%.  Physical Exam:  General: alert and no distress Lochia: appropriate Uterine Fundus: firm Incision: healing well DVT Evaluation: No evidence of DVT seen on physical exam.   Basename 04/14/12 0514 04/13/12 0957  HGB 7.3* 9.9*  HCT 21.7* 29.0*    Assessment/Plan: Plan for discharge tomorrow.  Anemia.  Clinically stable.  Start iron.   LOS: 2 days   HARPER,CHARLES A 04/14/2012, 9:08 AM

## 2012-04-15 LAB — RH IG WORKUP (INCLUDES ABO/RH): Gestational Age(Wks): 41

## 2012-04-15 LAB — TYPE AND SCREEN: Unit division: 0

## 2012-04-15 MED ORDER — FUSION PLUS PO CAPS: 1.0000 | ORAL_CAPSULE | Freq: Every day | ORAL | Status: DC

## 2012-04-15 MED ORDER — PRENATAL MULTIVITAMIN CH: 1.0000 | ORAL_TABLET | Freq: Every day | ORAL | Status: DC

## 2012-04-15 MED ORDER — OXYCODONE-ACETAMINOPHEN 5-325 MG PO TABS: 1.0000 | ORAL_TABLET | ORAL | Status: DC | PRN

## 2012-04-15 NOTE — Progress Notes (Signed)
Pt discharged before Sw could assess.  Sw will monitor drug screen results and make a referral if needed.

## 2012-04-15 NOTE — Progress Notes (Signed)
Post Partum Day 2 Subjective: no complaints, voiding, tolerating PO and + flatus  Objective: Blood pressure 103/66, pulse 103, temperature 97.6 F (36.4 C), temperature source Oral, resp. rate 20, height 5\' 3"  (1.6 m), weight 127.461 kg (281 lb), last menstrual period 06/19/2011, SpO2 100.00%.  Physical Exam:  General: alert and no distress Lochia: appropriate Uterine Fundus: firm Incision: healing well DVT Evaluation: No evidence of DVT seen on physical exam.   Basename 04/14/12 0514 04/13/12 0957  HGB 7.3* 9.9*  HCT 21.7* 29.0*    Assessment/Plan: Discharge home and Breastfeeding   LOS: 3 days   Deysy Schabel A 04/15/2012, 6:14 AM

## 2012-04-21 NOTE — Discharge Summary (Signed)
Obstetric Discharge Summary Reason for Admission: induction of labor Prenatal Procedures: ultrasound Intrapartum Procedures: spontaneous vaginal delivery Postpartum Procedures: none Complications-Operative and Postpartum: none Hemoglobin  Date Value Range Status  04/14/2012 7.3* 12.0 - 15.0 g/dL Final     REPEATED TO VERIFY     DELTA CHECK NOTED     HCT  Date Value Range Status  04/14/2012 21.7* 36.0 - 46.0 % Final    Physical Exam:  General: alert and no distress Lochia: appropriate Uterine Fundus: firm Incision: healing well DVT Evaluation: No evidence of DVT seen on physical exam.  Discharge Diagnoses: Term Pregnancy-delivered  Discharge Information: Date: 04/21/2012 Activity: pelvic rest Diet: routine Medications: PNV, Ibuprofen, Colace and Percocet Condition: stable Instructions: refer to practice specific booklet Discharge to: home   Newborn Data: Live born female  Birth Weight: 7 lb 8.5 oz (3415 g) APGAR: 9, 9  Home with mother.  Sylvia Chang A 04/21/2012, 6:12 AM

## 2013-03-23 ENCOUNTER — Encounter: Payer: Self-pay | Admitting: Obstetrics

## 2013-05-15 ENCOUNTER — Emergency Department (HOSPITAL_COMMUNITY): Payer: Medicaid Other

## 2013-05-15 ENCOUNTER — Encounter (HOSPITAL_COMMUNITY): Payer: Self-pay | Admitting: Emergency Medicine

## 2013-05-15 ENCOUNTER — Emergency Department (HOSPITAL_COMMUNITY)
Admission: EM | Admit: 2013-05-15 | Discharge: 2013-05-15 | Disposition: A | Discharge status: Home / Self Care | Payer: Medicaid Other | Attending: Emergency Medicine | Admitting: Emergency Medicine

## 2013-05-15 DIAGNOSIS — IMO0002 Reserved for concepts with insufficient information to code with codable children: Secondary | ICD-10-CM | POA: Insufficient documentation

## 2013-05-15 DIAGNOSIS — F17200 Nicotine dependence, unspecified, uncomplicated: Secondary | ICD-10-CM | POA: Insufficient documentation

## 2013-05-15 DIAGNOSIS — Z3202 Encounter for pregnancy test, result negative: Secondary | ICD-10-CM | POA: Insufficient documentation

## 2013-05-15 DIAGNOSIS — R21 Rash and other nonspecific skin eruption: Secondary | ICD-10-CM

## 2013-05-15 DIAGNOSIS — J40 Bronchitis, not specified as acute or chronic: Secondary | ICD-10-CM

## 2013-05-15 DIAGNOSIS — Z862 Personal history of diseases of the blood and blood-forming organs and certain disorders involving the immune mechanism: Secondary | ICD-10-CM | POA: Insufficient documentation

## 2013-05-15 DIAGNOSIS — Z8742 Personal history of other diseases of the female genital tract: Secondary | ICD-10-CM | POA: Insufficient documentation

## 2013-05-15 DIAGNOSIS — J45901 Unspecified asthma with (acute) exacerbation: Secondary | ICD-10-CM | POA: Insufficient documentation

## 2013-05-15 LAB — POCT PREGNANCY, URINE: Preg Test, Ur: NEGATIVE

## 2013-05-15 MED ORDER — PREDNISONE 20 MG PO TABS
40.0000 mg | ORAL_TABLET | Freq: Once | ORAL | Status: AC
  Administered 2013-05-15: 40 mg via ORAL
  Filled 2013-05-15: qty 2

## 2013-05-15 MED ORDER — DIPHENHYDRAMINE HCL 25 MG PO CAPS
25.0000 mg | ORAL_CAPSULE | Freq: Once | ORAL | Status: AC
  Administered 2013-05-15: 25 mg via ORAL
  Filled 2013-05-15: qty 1

## 2013-05-15 MED ORDER — PREDNISONE 20 MG PO TABS: 40.0000 mg | ORAL_TABLET | Freq: Every day | ORAL | Status: DC

## 2013-05-15 NOTE — ED Notes (Signed)
Pt states that she has had cough, congestion for three weeks but not getting better.  Cough is productive with yellow/green phlegm.  Pt states that she got rash on on bilat thighs three days ago that itches.  Pt denies using any new soap, shampoo, detergents or lotions.  Pt also states that she is two weeks late on her period.

## 2013-05-15 NOTE — ED Notes (Signed)
Bed: WA06 Expected date:  Expected time:  Means of arrival:  Comments: For pt. Last name Tondreau

## 2013-05-15 NOTE — ED Notes (Signed)
Patient transported to X-ray 

## 2013-05-20 NOTE — ED Provider Notes (Signed)
CSN: [id number]     Arrival date & time 05/15/13  1143 History   First MD Initiated Contact with Patient 05/15/13 1151     Chief Complaint  Patient presents with  . Cough  . Nasal Congestion  . Rash   (Consider location/radiation/quality/duration/timing/severity/associated sxs/prior Treatment) HPI  Past Medical History  Diagnosis Date  . Bronchitis   . Ovarian cyst   . Asthma   . Thrombocytopenia complicating pregnancy    Past Surgical History  Procedure Laterality Date  . Sinus surgery with instatrak    . Tonsillectomy and adenoidectomy    . Tubes in ears    . Appendectomy     Family History  Problem Relation Age of Onset  . Heart disease Mother     died at 72  . Hypertension Mother     died at 82  . Diabetes Father   . Diabetes Paternal Uncle   . Diabetes Paternal Grandfather    History  Substance Use Topics  . Smoking status: Current Every Day Smoker    Types: Cigarettes  . Smokeless tobacco: Never Used  . Alcohol Use: No   OB History   Grav Para Term Preterm Abortions TAB SAB Ect Mult Living   1 1 1       1      Review of Systems  22 year old female with cough and congestion. Symptom onset approximately 3 weeks ago. Since it's been stable since onset. She's tried taking over-the-counter cough medication without relief. Occasionally productive cough. No fevers or chills. No shortness of breath. No unusual leg pain or swelling mother she has noticed a rash on her distal thighs just for a few days ago. Mildly itchy. No new exposures that she is aware of. O'Donnell pain, nausea, vomiting or diarrhea. No dizziness or lightheadedness. Patient is a smoker.  Allergies  Review of patient's allergies indicates no known allergies.  Home Medications   Current Outpatient Rx  Name  Route  Sig  Dispense  Refill  . ibuprofen (ADVIL,MOTRIN) 200 MG tablet   Oral   Take 400 mg by mouth every 6 (six) hours as needed for headache.         . predniSONE (DELTASONE) 20 MG  tablet   Oral   Take 2 tablets (40 mg total) by mouth daily.   8 tablet   0    BP 126/67  Pulse 60  Temp(Src) 97.5 F (36.4 C) (Oral)  Resp 17  SpO2 99%  LMP 04/15/2013 Physical Exam  Nursing note and vitals reviewed. Constitutional: She is oriented to person, place, and time. She appears well-developed and well-nourished. No distress.  HENT:  Head: Normocephalic and atraumatic.  Eyes: Conjunctivae are normal. Right eye exhibits no discharge. Left eye exhibits no discharge.  Neck: Neck supple.  Cardiovascular: Normal rate, regular rhythm and normal heart sounds.  Exam reveals no gallop and no friction rub.   No murmur heard. Pulmonary/Chest: Effort normal. No respiratory distress. She has wheezes.  Faint expiratory wheezing, scattered. Appears comfortable. No excessive muscle usage. Speaking in complete sentences.  Abdominal: Soft. She exhibits no distension. There is no tenderness.  Musculoskeletal: She exhibits no edema and no tenderness.  Lower extremities symmetric as compared to each other. No calf tenderness. Negative Homan's. No palpable cords.   Neurological: She is alert and oriented to person, place, and time.  Skin: Skin is warm and dry.  Psychiatric: She has a normal mood and affect. Her behavior is normal. Thought content normal.  ED Course  Procedures (including critical care time) Labs Review Labs Reviewed  POCT PREGNANCY, URINE   Imaging Review No results found.  Dg Chest 2 View  05/15/2013   CLINICAL DATA:  Cough and congestion.  EXAM: CHEST  2 VIEW  COMPARISON:  04/13/2012  FINDINGS: The cardiac silhouette, mediastinal and hilar contours are no. The lungs are clear. No pleural effusion. Mild peribronchial thickening may suggest bronchitis. The bony thorax is intact.  IMPRESSION: Possible mild bronchitis. No focal infiltrates.   Electronically Signed   By: Loralie Champagne M.D.   On: 05/15/2013 13:10   EKG Interpretation   None       MDM   1.  Bronchitis   2. Rash and nonspecific skin eruption    Is-year-old female with likely viral illness consistent with bronchitis. Symptomatic treatment  Including a course of steroids. She has no increased work of breathing. She generally appears well. A Veress low suspicion for emergent process. She is a nonspecific rash as well her bilateral thighs. No evidence of anaphylaxis. No obvious trigger. When necessary Benadryl her low-dose hydrocortisone.    Raeford Razor, MD 05/20/13 1350

## 2013-05-30 ENCOUNTER — Encounter (HOSPITAL_COMMUNITY): Payer: Self-pay | Admitting: General Practice

## 2013-05-30 ENCOUNTER — Inpatient Hospital Stay (HOSPITAL_COMMUNITY)
Admission: AD | Admit: 2013-05-30 | Discharge: 2013-05-30 | Disposition: A | Discharge status: Home / Self Care | Payer: Medicaid Other | Source: Ambulatory Visit | Attending: Obstetrics | Admitting: Obstetrics

## 2013-05-30 DIAGNOSIS — M545 Low back pain, unspecified: Secondary | ICD-10-CM | POA: Insufficient documentation

## 2013-05-30 DIAGNOSIS — N72 Inflammatory disease of cervix uteri: Secondary | ICD-10-CM

## 2013-05-30 DIAGNOSIS — R109 Unspecified abdominal pain: Secondary | ICD-10-CM | POA: Insufficient documentation

## 2013-05-30 LAB — URINALYSIS, ROUTINE W REFLEX MICROSCOPIC
Bilirubin Urine: NEGATIVE
Nitrite: NEGATIVE
Specific Gravity, Urine: >1.03 — ABNORMAL HIGH (ref 1.005–1.030)
Urobilinogen, UA: 1 mg/dL (ref 0.0–1.0)

## 2013-05-30 LAB — WET PREP, GENITAL: Trich, Wet Prep: NONE SEEN

## 2013-05-30 LAB — POCT PREGNANCY, URINE: Preg Test, Ur: NEGATIVE

## 2013-05-30 MED ORDER — LIDOCAINE HCL (PF) 1 % IJ SOLN
INTRAMUSCULAR | Status: AC
  Filled 2013-05-30: qty 30

## 2013-05-30 MED ORDER — AZITHROMYCIN 250 MG PO TABS
1000.0000 mg | ORAL_TABLET | Freq: Once | ORAL | Status: AC
  Administered 2013-05-30: 1000 mg via ORAL
  Filled 2013-05-30: qty 4

## 2013-05-30 MED ORDER — HYDROCODONE-ACETAMINOPHEN 5-325 MG PO TABS
2.0000 | ORAL_TABLET | Freq: Once | ORAL | Status: AC
  Administered 2013-05-30: 2 via ORAL
  Filled 2013-05-30: qty 2

## 2013-05-30 MED ORDER — CEFTRIAXONE SODIUM 1 G IJ SOLR
500.0000 mg | Freq: Once | INTRAMUSCULAR | Status: AC
  Administered 2013-05-30: 500 mg via INTRAMUSCULAR
  Filled 2013-05-30: qty 10

## 2013-05-30 NOTE — MAU Provider Note (Signed)
History     CSN: [id number]  Arrival date and time: 05/30/13 1940   First Provider Initiated Contact with Patient 05/30/13 2020      Chief Complaint  Patient presents with  . Abdominal Pain  . Back Pain   HPI  Sylvia Chang is a 22 y.o. G1P1001 who presents today with lower back pain that wraps around to the front. She rates 7/10. She denies any abnormal vaginal bleeding. She states that her LMP was 05/11/13. She denies any fever, nausea, vomiting, diarrhea or dysuria. She denies any urinary symptoms. She has not tried any medication for pain at home prior to arrival.   Past Medical History  Diagnosis Date  . Bronchitis   . Ovarian cyst   . Asthma   . Thrombocytopenia complicating pregnancy     Past Surgical History  Procedure Laterality Date  . Sinus surgery with instatrak    . Tonsillectomy and adenoidectomy    . Tubes in ears    . Appendectomy      Family History  Problem Relation Age of Onset  . Heart disease Mother     died at 36  . Hypertension Mother     died at 58  . Diabetes Father   . Diabetes Paternal Uncle   . Diabetes Paternal Grandfather     History  Substance Use Topics  . Smoking status: Current Every Day Smoker    Types: Cigarettes  . Smokeless tobacco: Never Used  . Alcohol Use: No    Allergies: No Known Allergies  Prescriptions prior to admission  Medication Sig Dispense Refill  . predniSONE (DELTASONE) 20 MG tablet Take 2 tablets (40 mg total) by mouth daily.  8 tablet  0    ROS Physical Exam   Blood pressure 104/65, pulse 72, temperature 98.3 F (36.8 C), temperature source Oral, resp. rate 18, height 5\' 3"  (1.6 m), weight 110.678 kg (244 lb), last menstrual period 05/11/2013, SpO2 100.00%.  Physical Exam  Nursing note and vitals reviewed. Constitutional: She is oriented to person, place, and time. She appears well-developed and well-nourished. No distress.  Cardiovascular: Normal rate.   Respiratory: Effort normal.   GI: Soft. There is no tenderness. There is no rebound and no guarding.  Genitourinary:  No CVA tenderness  External: no lesion Vagina: small amount of white discharge Cervix: pink, smooth, + CMT  Uterus: NSSC Adnexa: NT   Neurological: She is alert and oriented to person, place, and time.  Skin: Skin is warm and dry.  Psychiatric: She has a normal mood and affect.    MAU Course  Procedures  Results for orders placed during the hospital encounter of 05/30/13 (from the past 24 hour(s))  WET PREP, GENITAL     Status: Abnormal   Collection Time    05/30/13  7:28 PM      Result Value Range   Yeast Wet Prep HPF POC NONE SEEN  NONE SEEN   Trich, Wet Prep NONE SEEN  NONE SEEN   Clue Cells Wet Prep HPF POC FEW (*) NONE SEEN   WBC, Wet Prep HPF POC MODERATE (*) NONE SEEN  URINALYSIS, ROUTINE W REFLEX MICROSCOPIC     Status: Abnormal   Collection Time    05/30/13  8:01 PM      Result Value Range   Color, Urine YELLOW  YELLOW   APPearance CLEAR  CLEAR   Specific Gravity, Urine >1.030 (*) 1.005 - 1.030   pH 6.0  5.0 - 8.0  Glucose, UA NEGATIVE  NEGATIVE mg/dL   Hgb urine dipstick NEGATIVE  NEGATIVE   Bilirubin Urine NEGATIVE  NEGATIVE   Ketones, ur NEGATIVE  NEGATIVE mg/dL   Protein, ur NEGATIVE  NEGATIVE mg/dL   Urobilinogen, UA 1.0  0.0 - 1.0 mg/dL   Nitrite NEGATIVE  NEGATIVE   Leukocytes, UA NEGATIVE  NEGATIVE  POCT PREGNANCY, URINE     Status: None   Collection Time    05/30/13  8:06 PM      Result Value Range   Preg Test, Ur NEGATIVE  NEGATIVE     Assessment and Plan   1. Cervicitis    Treated here with rocephin and azithro FU with Dr. Clearance Coots as needed   Tawnya Crook 05/30/2013, 8:22 PM

## 2013-05-30 NOTE — MAU Note (Signed)
Pt reports sudden onset of sharp pain and pressure on rt lower back and right side. Pain started about 1.5 hours ago and is worsening. Denies nausea, vomiting, diarrhea, or fever. LMP 05/11/2013 but it only lasted 2 days. Had an IUD that came out in August and has been using condoms since then.

## 2013-05-31 LAB — GC/CHLAMYDIA PROBE AMP: CT Probe RNA: NEGATIVE

## 2013-08-02 ENCOUNTER — Encounter (HOSPITAL_COMMUNITY): Payer: Self-pay | Admitting: Emergency Medicine

## 2013-08-02 ENCOUNTER — Emergency Department (HOSPITAL_COMMUNITY)
Admission: EM | Admit: 2013-08-02 | Discharge: 2013-08-03 | Disposition: A | Discharge status: Home / Self Care | Payer: Medicaid Other | Attending: Emergency Medicine | Admitting: Emergency Medicine

## 2013-08-02 DIAGNOSIS — Z8639 Personal history of other endocrine, nutritional and metabolic disease: Secondary | ICD-10-CM | POA: Insufficient documentation

## 2013-08-02 DIAGNOSIS — R5381 Other malaise: Secondary | ICD-10-CM | POA: Insufficient documentation

## 2013-08-02 DIAGNOSIS — R112 Nausea with vomiting, unspecified: Secondary | ICD-10-CM | POA: Insufficient documentation

## 2013-08-02 DIAGNOSIS — Z862 Personal history of diseases of the blood and blood-forming organs and certain disorders involving the immune mechanism: Secondary | ICD-10-CM | POA: Insufficient documentation

## 2013-08-02 DIAGNOSIS — J45909 Unspecified asthma, uncomplicated: Secondary | ICD-10-CM | POA: Insufficient documentation

## 2013-08-02 DIAGNOSIS — F17200 Nicotine dependence, unspecified, uncomplicated: Secondary | ICD-10-CM | POA: Insufficient documentation

## 2013-08-02 DIAGNOSIS — Z3202 Encounter for pregnancy test, result negative: Secondary | ICD-10-CM | POA: Insufficient documentation

## 2013-08-02 DIAGNOSIS — M545 Low back pain, unspecified: Secondary | ICD-10-CM | POA: Insufficient documentation

## 2013-08-02 DIAGNOSIS — R5383 Other fatigue: Secondary | ICD-10-CM

## 2013-08-02 DIAGNOSIS — B9789 Other viral agents as the cause of diseases classified elsewhere: Secondary | ICD-10-CM | POA: Insufficient documentation

## 2013-08-02 DIAGNOSIS — B349 Viral infection, unspecified: Secondary | ICD-10-CM

## 2013-08-02 LAB — URINALYSIS, ROUTINE W REFLEX MICROSCOPIC
BILIRUBIN URINE: NEGATIVE
Glucose, UA: NEGATIVE mg/dL
Hgb urine dipstick: NEGATIVE
KETONES UR: NEGATIVE mg/dL
Nitrite: NEGATIVE
PH: 6 (ref 5.0–8.0)
Protein, ur: NEGATIVE mg/dL
Specific Gravity, Urine: 1.013 (ref 1.005–1.030)
Urobilinogen, UA: 0.2 mg/dL (ref 0.0–1.0)

## 2013-08-02 LAB — URINE MICROSCOPIC-ADD ON

## 2013-08-02 LAB — PREGNANCY, URINE: Preg Test, Ur: NEGATIVE

## 2013-08-02 NOTE — ED Notes (Signed)
Pt states she has a sore throat like she has strep  Pt states she states she has been feeling very tired lately and has been nauseated and been having waves of nausea with vomiting for the past three days  Pt states she has a history of anemia and states this feels the same way

## 2013-08-02 NOTE — ED Notes (Signed)
Pt states she has been having pain in her lower back that she rates as a 3/10 on pain scale

## 2013-08-03 LAB — POCT I-STAT, CHEM 8
BUN: 13 mg/dL (ref 6–23)
CREATININE: 0.7 mg/dL (ref 0.50–1.10)
Calcium, Ion: 1.21 mmol/L (ref 1.12–1.23)
Chloride: 108 mEq/L (ref 96–112)
Glucose, Bld: 94 mg/dL (ref 70–99)
HEMATOCRIT: 42 % (ref 36.0–46.0)
HEMOGLOBIN: 14.3 g/dL (ref 12.0–15.0)
Potassium: 3.9 mEq/L (ref 3.7–5.3)
SODIUM: 141 meq/L (ref 137–147)
TCO2: 23 mmol/L (ref 0–100)

## 2013-08-03 LAB — CBC WITH DIFFERENTIAL/PLATELET
BASOS PCT: 0 % (ref 0–1)
Basophils Absolute: 0 10*3/uL (ref 0.0–0.1)
EOS ABS: 0.2 10*3/uL (ref 0.0–0.7)
Eosinophils Relative: 3 % (ref 0–5)
HCT: 40.7 % (ref 36.0–46.0)
Hemoglobin: 13.8 g/dL (ref 12.0–15.0)
LYMPHS ABS: 3 10*3/uL (ref 0.7–4.0)
Lymphocytes Relative: 42 % (ref 12–46)
MCH: 29.6 pg (ref 26.0–34.0)
MCHC: 33.9 g/dL (ref 30.0–36.0)
MCV: 87.3 fL (ref 78.0–100.0)
Monocytes Absolute: 0.5 10*3/uL (ref 0.1–1.0)
Monocytes Relative: 7 % (ref 3–12)
NEUTROS PCT: 48 % (ref 43–77)
Neutro Abs: 3.4 10*3/uL (ref 1.7–7.7)
PLATELETS: 143 10*3/uL — AB (ref 150–400)
RBC: 4.66 MIL/uL (ref 3.87–5.11)
RDW: 12.7 % (ref 11.5–15.5)
WBC: 7.1 10*3/uL (ref 4.0–10.5)

## 2013-08-03 NOTE — ED Notes (Signed)
Patient is alert and oriented x3.  She was given DC instructions and follow up visit instructions.  Patient gave verbal understanding. She was DC ambulatory under her own power to home.  V/S stable.  He was not showing any signs of distress on DC 

## 2013-08-03 NOTE — ED Provider Notes (Signed)
CSN: [id number]     Arrival date & time 08/02/13  2052 History   First MD Initiated Contact with Patient 08/03/13 0007     Chief Complaint  Patient presents with  . Sore Throat  . Fatigue  . Emesis   (Consider location/radiation/quality/duration/timing/severity/associated sxs/prior Treatment) HPI Comments: Patient is a 23 year old female with history of thrombocytopenia while pregnant who presents today for 1 week of gradually worsening fatigue. She has associated sore throat. Her sore throat is scratchy and worse with swallowing. The patient has no trouble speaking. She has nausea and vomiting. The vomiting has appeared to resolve over the past few days. She has a very mild, productive cough. She does not believe she has a fever, but has not measured it. She has not taken any medications for her symptoms. Her daughter has the same symptoms. Additionally she has some mild low back pain that has been gradually worsening over the past 3 days. The pain is burning without radiation. No urinary sx. She reports she is concerned because the fatigue she is feeling is similar to that of the fatigue she had with her thrombocytopenia.   The history is provided by the patient. No language interpreter was used.    Past Medical History  Diagnosis Date  . Bronchitis   . Ovarian cyst   . Asthma   . Thrombocytopenia complicating pregnancy    Past Surgical History  Procedure Laterality Date  . Sinus surgery with instatrak    . Tonsillectomy and adenoidectomy    . Tubes in ears    . Appendectomy     Family History  Problem Relation Age of Onset  . Heart disease Mother     died at 4537  . Hypertension Mother     died at 3337  . Diabetes Father   . Diabetes Paternal Uncle   . Diabetes Paternal Grandfather    History  Substance Use Topics  . Smoking status: Current Every Day Smoker    Types: Cigarettes  . Smokeless tobacco: Never Used  . Alcohol Use: No   OB History   Grav Para Term Preterm  Abortions TAB SAB Ect Mult Living   1 1 1       1      Review of Systems  Constitutional: Positive for fatigue. Negative for fever, chills and diaphoresis.  HENT: Positive for sore throat.   Respiratory: Positive for cough. Negative for shortness of breath.   Cardiovascular: Negative for chest pain.  Gastrointestinal: Positive for nausea and vomiting. Negative for abdominal pain, diarrhea, constipation, blood in stool and anal bleeding.  Genitourinary: Negative for dysuria, frequency, hematuria, vaginal bleeding, vaginal discharge, difficulty urinating, vaginal pain and menstrual problem.  Skin: Negative for pallor.  All other systems reviewed and are negative.    Allergies  Review of patient's allergies indicates no known allergies.  Home Medications   Current Outpatient Rx  Name  Route  Sig  Dispense  Refill  . ibuprofen (ADVIL,MOTRIN) 200 MG tablet   Oral   Take 200 mg by mouth every 6 (six) hours as needed for moderate pain.         . naproxen sodium (ANAPROX) 220 MG tablet   Oral   Take 220 mg by mouth as needed (pain.).          BP 120/55  Pulse 92  Temp(Src) 97.8 F (36.6 C) (Oral)  Resp 18  Ht 5\' 3"  (1.6 m)  Wt 240 lb (108.863 kg)  BMI 42.52 kg/m2  SpO2 100%  LMP 07/04/2013 Physical Exam  Nursing note and vitals reviewed. Constitutional: She is oriented to person, place, and time. She appears well-developed and well-nourished. No distress.  HENT:  Head: Normocephalic and atraumatic.  Right Ear: Tympanic membrane, external ear and ear canal normal.  Left Ear: Tympanic membrane, external ear and ear canal normal.  Nose: Nose normal.  Mouth/Throat: Uvula is midline, oropharynx is clear and moist and mucous membranes are normal. No trismus in the jaw. No uvula swelling. No oropharyngeal exudate, posterior oropharyngeal edema, posterior oropharyngeal erythema or tonsillar abscesses.  Eyes: Conjunctivae are normal.  Neck: Normal range of motion.   Cardiovascular: Normal rate, regular rhythm and normal heart sounds.   Pulmonary/Chest: Effort normal and breath sounds normal. No stridor. No respiratory distress. She has no wheezes. She has no rales.  Abdominal: Soft. She exhibits no distension.  Musculoskeletal: Normal range of motion.  Neurological: She is alert and oriented to person, place, and time. She has normal strength.  Skin: Skin is warm and dry. She is not diaphoretic. No erythema.  Psychiatric: She has a normal mood and affect. Her behavior is normal.    ED Course  Procedures (including critical care time) Labs Review Labs Reviewed  URINALYSIS, ROUTINE W REFLEX MICROSCOPIC - Abnormal; Notable for the following:    Leukocytes, UA TRACE (*)    All other components within normal limits  CBC WITH DIFFERENTIAL - Abnormal; Notable for the following:    Platelets 143 (*)    All other components within normal limits  PREGNANCY, URINE  URINE MICROSCOPIC-ADD ON  POCT I-STAT, CHEM 8   Imaging Review No results found.  EKG Interpretation   None       MDM   1. Viral syndrome   2. Fatigue    Pt afebrile without tonsillar exudate. Centor criteria 0, no further testing indicated. Presents with dysphagia; diagnosis of viral pharyngitis. No abx indicated. DC w symptomatic tx for pain  Pt does not appear dehydrated, but did discuss importance of water rehydration. Presentation non concerning for PTA or infxn spread to soft tissue. No trismus or uvula deviation. Specific return precautions discussed. Pt able to drink water in ED without difficulty with intact air way. Recommended PCP follow up.  Patient has platelet count of 143. No associated anemia. I discussed this result with the patient and explained that it was mildly low. She has no sign of bleeding and I believe her fatigue is due to viral illness. She understands to follow up with PCP for repeat lab work. I gave the patient strict return instructions. Vital signs stable  for discharge. Patient / Family / Caregiver informed of clinical course, understand medical decision-making process, and agree with plan.   Mora Bellman, PA-C 08/03/13 1622

## 2013-08-03 NOTE — Discharge Instructions (Signed)
Viral Infections A virus is a type of germ. Viruses can cause:  Minor sore throats.  Aches and pains.  Headaches.  Runny nose.  Rashes.  Watery eyes.  Tiredness.  Coughs.  Loss of appetite.  Feeling sick to your stomach (nausea).  Throwing up (vomiting).  Watery poop (diarrhea). HOME CARE   Only take medicines as told by your doctor.  Drink enough water and fluids to keep your pee (urine) clear or pale yellow. Sports drinks are a good choice.  Get plenty of rest and eat healthy. Soups and broths with crackers or rice are fine. GET HELP RIGHT AWAY IF:   You have a very bad headache.  You have shortness of breath.  You have chest pain or neck pain.  You have an unusual rash.  You cannot stop throwing up.  You have watery poop that does not stop.  You cannot keep fluids down.  You or your child has a temperature by mouth above 102 F (38.9 C), not controlled by medicine.  Your baby is older than 3 months with a rectal temperature of 102 F (38.9 C) or higher.  Your baby is 98 months old or younger with a rectal temperature of 100.4 F (38 C) or higher. MAKE SURE YOU:   Understand these instructions.  Will watch this condition.  Will get help right away if you are not doing well or get worse. Document Released: 06/27/2008 Document Revised: 10/07/2011 Document Reviewed: 11/20/2010 [facility] Patient Information 2014 [place], [place].   Emergency Department Resource Guide 1) Find a Doctor and Pay Out of Pocket Although you won't have to find out who is covered by your insurance plan, it is a good idea to ask around and get recommendations. You will then need to call the office and see if the doctor you have chosen will accept you as a new patient and what types of options they offer for patients who are self-pay. Some doctors offer discounts or will set up payment plans for their patients who do not have insurance, but you will need to ask so you aren't  surprised when you get to your appointment.  2) Contact Your Local Health Department Not all health departments have doctors that can see patients for sick visits, but many do, so it is worth a call to see if yours does. If you don't know where your local health department is, you can check in your phone book. The CDC also has a tool to help you locate your state's health department, and many state websites also have listings of all of their local health departments.  3) Find a Walk-in Clinic If your illness is not likely to be very severe or complicated, you may want to try a walk in clinic. These are popping up all over the country in pharmacies, drugstores, and shopping centers. They're usually staffed by nurse practitioners or physician assistants that have been trained to treat common illnesses and complaints. They're usually fairly quick and inexpensive. However, if you have serious medical issues or chronic medical problems, these are probably not your best option.  No Primary Care Doctor: - Call Health Connect at  [phone] - they can help you locate a primary care doctor that  accepts your insurance, provides certain services, etc. - Physician Referral Service- [phone]  Chronic Pain Problems: Organization         Address  Phone   Notes  [facility]  [phone] Patients need to be referred  by their primary care doctor.   Medication Assistance: Organization         Address  Phone   Notes  [facility] Medication [facility] [address] [place], [place] [address] [phone]) [phone] --Must be a resident of [facility] [place] -- Must have NO insurance coverage whatsoever (no Medicaid/ Medicare, etc.) -- The pt. MUST have a primary care doctor that directs their care regularly and follows them in the community   MedAssist  [phone]   [employer]  [phone]    Agencies that provide inexpensive medical care: Organization          Address  Phone   Notes  [facility]  [phone]   [facility]    [phone]) [phone]   [facility] [address] [place], [place] [address] [phone]) [phone]   [facility] [address], [place] [phone]) [phone]   Planned Parenthood    [phone](336) [phone]   [facility]    [phone]   [facility]  [address], [place] Phone:  [phone], Fax:  [phone](336) [phone] Hours of Operation:  9 am - 6 pm, M-F.  Also accepts Medicaid/Medicare and self-pay.  [facility]  [address], [place] Phone: [phone], Fax: [phone]. Hours of Operation:  8:30 am - 5:30 pm, M-F.  Also accepts Medicaid and self-pay.  [facility] [place] [address], [place] Phone: [phone]) [phone]   [facility] [address], [place], [place] [phone], Ext. [phone] Mondays & Thursdays: 7-9 AM.  First 15 patients are seen on a first come, first serve basis.    Medicaid-accepting [facility] Providers:  Organization         Address  Phone   Notes  [facility] [address], [place] [phone]) [phone] Also accepts self-pay patients.  [facility] [address], [place]  [phone]) [phone]   [facility] [address], [place] [phone]) [phone]   [facility] [address] [place] [address], [place] [phone]) [phone]   Renaye RakersVeita Bland [address], [place]   [phone] Only accepts [place] Access [place]icaid patients after they have their name applied to their card.   Self-Pay (no insurance) in [facility]:  Organization         Address  Phone   Notes  Sickle Cell Patients, [facility] [address], [place] [phone]) [phone]   [facility] [address], [place] [phone]) [phone]     [facility] [place]  [address], [place] [phone]   [facility]/Dr. Osei-Bonsu  [address] [place] [address], [place] or [id number], [place] [phone]) [phone] Phone number for both [place] and [place] locations is the same.  [facility] [address], [place] [phone]) [phone]   [facility] [place] [address] [place] [address], [place] or [address] [phone]) [phone]) [phone]   [facility] [address], [place] [phone]) [phone], phone; [phone](336) [phone], fax Sees patients 1st and 3rd Saturday of every month.  Must not qualify for public or private insurance (i.e. Medicaid, Medicare, [place] Health Choice, Veterans' Benefits)  Household income should be no  more than 200% of the poverty level The clinic cannot treat you if you are pregnant or think you are pregnant  Sexually transmitted diseases are not treated at the clinic.    Dental Care: Organization         Address  Phone  Notes  [facility] of [facility] [address] [place], [place] [phone] Accepts children up to age 32 who are enrolled in [place] or [place] Health Choice; pregnant women with a Medicaid card; and children who have applied for Medicaid or [place] Health Choice, but were declined, whose parents can pay a reduced fee at time of service.  [facility] of [facility]  [address], [place] [phone] Accepts children up to age 35 who are enrolled in [place] or [place] Health Choice; pregnant women with a Medicaid card; and children who have applied for Medicaid or [place] Health Choice, but were declined, whose parents can pay a reduced fee at time of service.  [facility]  [address] [place], [place] [phone] Patients are seen by appointment only. Walk-ins are not accepted. [facility] will see patients 83  years of age and older. Monday - Tuesday (8am-5pm) Most Wednesdays (8:30-5pm) $30 per visit, cash only  [facility] Access PRO[facility]  [address], [facility] [phone] Patients are seen by appointment only. Walk-ins are not accepted. [facility] will see patients 83 years of age and older. One Wednesday Evening (Monthly: Volunteer Based).  $30 per visit, cash only  [employer]  [phone] for adults; Children under age 55, call Graduate Pediatric Dentistry at [phone]. Children aged 20-14, please call [phone] to request a pediatric application.  Dental services are provided in all areas of dental care including fillings, crowns and bridges, complete and partial dentures, implants, gum treatment, root canals, and extractions. Preventive care is also provided. Treatment is provided to both adults and children. Patients are selected via a lottery and there is often a waiting list.   [facility] [address] [place]  [phone] [email or web address]   [facility] [address] [place], [place] [phone], Ext. [phone] Second and Fourth Thursday of each month, opens at 6:30 AM; Clinic ends at 9 AM.  Patients are seen on a first-come first-served basis, and a limited number are seen during each clinic.   [facility]  [address] Ether [place]ins [place], [place] [phone]   Eligibility Requirements You must have lived in [place], [place], or [place] counties for at least the last three months.   You cannot be eligible for state or federal sponsored [place], including [employer], [place], or [employer].   You generally cannot be eligible for healthcare insurance through your employer.    How to apply: Eligibility screenings are held every Tuesday and Wednesday afternoon from 1:00 pm until 4:00 pm. You do not need an appointment for the interview!  [facility]  [address] [place], [place] [phone]   [facility] Health Department  [phone]   [facility] Health Department  [phone]   [facility] Health Department  [phone]    Behavioral Health Resources in the Community: Intensive Outpatient Programs Organization         Address  Phone  Notes  [facility] [address], [place], [place] [phone]   [facility] [address], [place], [place] [phone]   [facility] [address], [place], [place]  [phone]   [facility] [address],  [place], [place] [phone] or [phone]   Substance Abuse Resources Organization         Address  Phone  Notes  [facility]  [phone]   [facility]  [phone]   [facility]  [phone]   [facility]  [phone]   Residential & Outpatient Substance Abuse Program  [phone]   Psychological Services Organization         Address  Phone  Notes  [facility]  [phone]- [phone]   [facility]  [phone]- [phone]   [facility] [address], [place] [phone] or [phone]    Mobile Crisis Teams Organization         Address  Phone  Notes  [facility], Mobile Crisis Care Unit  [phone]   [facility]  [address] [place], [place] [phone]   Doristine LocksSharon DeEsch [address] [place] [phone]    Self-Help/Support Groups Organization         Address  Phone             Notes  [facility] [place] - variety of support groups  336- [phone] Call for more information  Narcotics Anonymous (NA), [facility] [address], [place]  2 meetings at this location   Statisticianesidential Treatment Programs Organization         Address  Phone  Notes  [facility] [address],    [place]   [phone]   [facility]  [address], [place] [phone], [place], [place] [phone]   [facility] [address], [place] [phone] Admissions: 8am-3pm M-F  [facility] [address]    [place], [place] [phone]   [facility] [address], [place], [place] [phone]   [facility] [address]  [place], [place] [phone]   [facility] - Intensive Outpatient [address], [place], [place] [phone]   [facility] ([facility] [address]  [place], [place] [phone] or [phone]   [facility] ([facility]) [address] [place], [place] [phone] Accepts Medicaid  [facility] [address]  [place] [phone] Substance Abuse/Addiction Treatment   [facility] Behavioral Health Resources Organization         Address  Phone  Notes  [facility]  [phone]) [phone]   Angie FavaJulie Brannon, PhD [address], [address] [place], [place]   [phone] or [phone]   [facility]   [address] [place], [place] [phone](336) [phone]   [facility] [address], [place], [place] [phone](336) [phone] Insurance/Medicaid/sponsorship through [facility]  [facility] [address]                                    [place], [place] [phone](336) [phone] Therapy/tele-psych/case  [facility] [address]   [place], [place] [phone]) [phone]    Dr. Lolly MustacheArfeen  [phone]) [phone]   [facility]  [employer] [facility] 1) [address], [place] 2) [address], [place] 3)  [phone]  [address], [place] [phone]  [phone]   [facility] Child Abuse Hotline 848-800-1241 or [phone] (After Hours)

## 2013-08-04 NOTE — ED Provider Notes (Signed)
Medical screening examination/treatment/procedure(s) were performed by non-physician practitioner and as supervising physician I was immediately available for consultation/collaboration.  EKG Interpretation   None        Mckinnon Glick K Aquarius Latouche-Rasch, MD 08/04/13 0202

## 2013-08-09 IMAGING — CR DG CHEST 1V PORT
1 series · 1 of 1 positions shown · non-contrast
Comparison: 08/25/2003

CLINICAL DATA: Postpartum from vaginal delivery.  Tachycardia.

CHEST - 1 VIEW

[view not recorded]
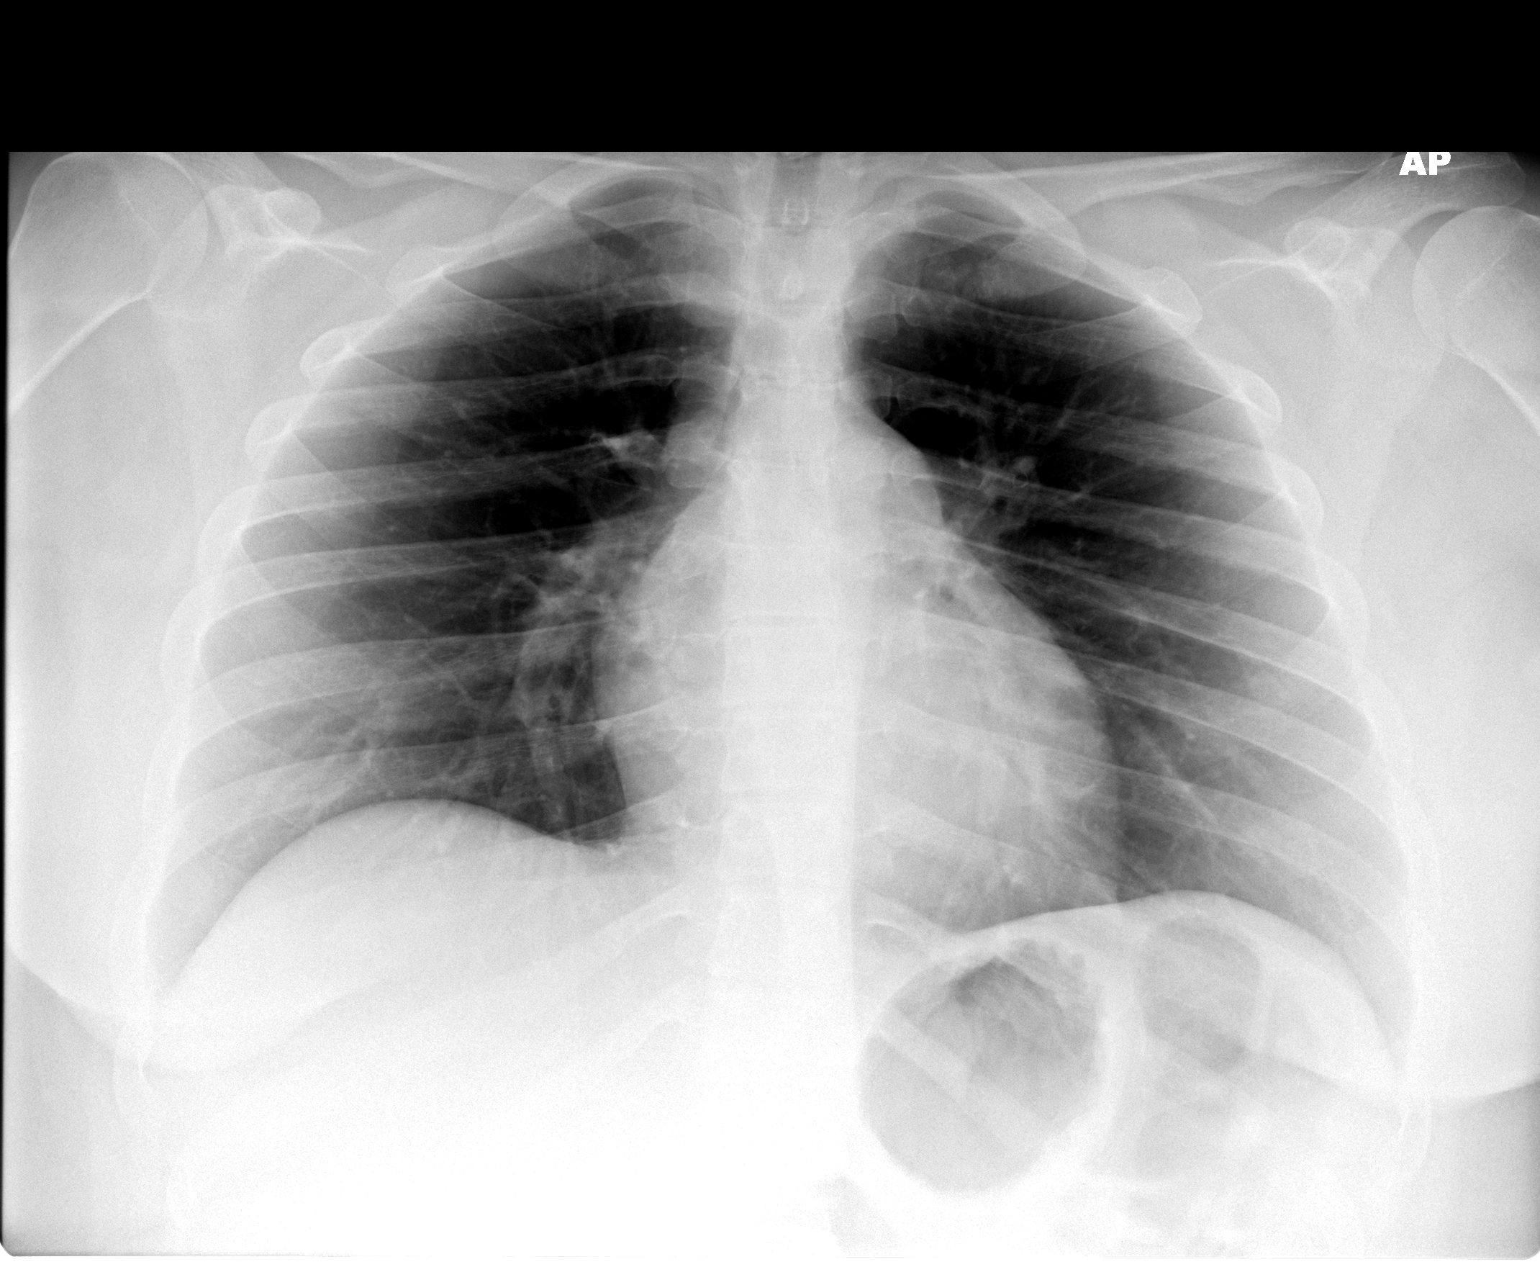

[1 of 1 positions shown; findings below may reference images not displayed]

FINDINGS: The heart size and mediastinal contours are within normal
limits.  Both lungs are clear.
IMPRESSION: No active disease.

## 2013-11-01 ENCOUNTER — Emergency Department (HOSPITAL_COMMUNITY): Payer: Medicaid Other

## 2013-11-01 DIAGNOSIS — M25519 Pain in unspecified shoulder: Secondary | ICD-10-CM | POA: Insufficient documentation

## 2013-11-01 DIAGNOSIS — F17200 Nicotine dependence, unspecified, uncomplicated: Secondary | ICD-10-CM | POA: Insufficient documentation

## 2013-11-01 DIAGNOSIS — J45901 Unspecified asthma with (acute) exacerbation: Secondary | ICD-10-CM | POA: Insufficient documentation

## 2013-11-01 DIAGNOSIS — Z8742 Personal history of other diseases of the female genital tract: Secondary | ICD-10-CM | POA: Insufficient documentation

## 2013-11-01 LAB — URINALYSIS, ROUTINE W REFLEX MICROSCOPIC
Bilirubin Urine: NEGATIVE
GLUCOSE, UA: NEGATIVE mg/dL
HGB URINE DIPSTICK: NEGATIVE
KETONES UR: NEGATIVE mg/dL
Nitrite: NEGATIVE
PROTEIN: NEGATIVE mg/dL
Specific Gravity, Urine: 1.013 (ref 1.005–1.030)
UROBILINOGEN UA: 0.2 mg/dL (ref 0.0–1.0)
pH: 7 (ref 5.0–8.0)

## 2013-11-01 LAB — I-STAT TROPONIN, ED: TROPONIN I, POC: 0 ng/mL (ref 0.00–0.08)

## 2013-11-01 LAB — CBC
HCT: 40.2 % (ref 36.0–46.0)
Hemoglobin: 14.2 g/dL (ref 12.0–15.0)
MCH: 30.4 pg (ref 26.0–34.0)
MCHC: 35.3 g/dL (ref 30.0–36.0)
MCV: 86.1 fL (ref 78.0–100.0)
PLATELETS: 126 10*3/uL — AB (ref 150–400)
RBC: 4.67 MIL/uL (ref 3.87–5.11)
RDW: 12.9 % (ref 11.5–15.5)
WBC: 6.5 10*3/uL (ref 4.0–10.5)

## 2013-11-01 LAB — URINE MICROSCOPIC-ADD ON

## 2013-11-01 LAB — POC URINE PREG, ED: Preg Test, Ur: NEGATIVE

## 2013-11-01 LAB — BASIC METABOLIC PANEL
BUN: 13 mg/dL (ref 6–23)
CO2: 20 mEq/L (ref 19–32)
Calcium: 9 mg/dL (ref 8.4–10.5)
Chloride: 105 mEq/L (ref 96–112)
Creatinine, Ser: 0.54 mg/dL (ref 0.50–1.10)
GFR calc Af Amer: >90 mL/min (ref >90)
GFR calc non Af Amer: >90 mL/min (ref >90)
Glucose, Bld: 101 mg/dL — ABNORMAL HIGH (ref 70–99)
Potassium: 4.2 mEq/L (ref 3.7–5.3)
SODIUM: 140 meq/L (ref 137–147)

## 2013-11-01 LAB — PRO B NATRIURETIC PEPTIDE: PRO B NATRI PEPTIDE: 49.6 pg/mL (ref 0–125)

## 2013-11-01 NOTE — ED Notes (Signed)
Pt reports left arm pain, headache, and feeling flush. Pt was at work when symptoms began. Pt also reports difficulty catching her breath while at work. Pt is A&Ox4, respirations equal and unlabored, skin warm and dry.

## 2013-11-01 NOTE — ED Notes (Signed)
Pt denies chest pain/left arm pain at this time but states when she does have the pain it is 8/10

## 2013-11-02 ENCOUNTER — Emergency Department (HOSPITAL_COMMUNITY)
Admission: EM | Admit: 2013-11-02 | Discharge: 2013-11-02 | Disposition: A | Discharge status: Home / Self Care | Payer: Medicaid Other | Attending: Emergency Medicine | Admitting: Emergency Medicine

## 2013-11-02 DIAGNOSIS — R0600 Dyspnea, unspecified: Secondary | ICD-10-CM

## 2013-11-02 DIAGNOSIS — M25512 Pain in left shoulder: Secondary | ICD-10-CM

## 2013-11-02 MED ORDER — IBUPROFEN 400 MG PO TABS: 400.0000 mg | ORAL_TABLET | Freq: Four times a day (QID) | ORAL | Status: DC | PRN

## 2013-11-02 MED ORDER — IBUPROFEN 400 MG PO TABS
600.0000 mg | ORAL_TABLET | Freq: Once | ORAL | Status: AC
  Administered 2013-11-02: 600 mg via ORAL
  Filled 2013-11-02 (×2): qty 1

## 2013-11-02 NOTE — Discharge Instructions (Signed)
We saw you in the ER for the left shoudler pain and shortness of breath. All the results in the ER are normal, labs and imaging. We are not sure what is causing your symptoms. The workup in the ER is not complete, and is limited to screening for life threatening and emergent conditions only, so please see a primary care doctor for further evaluation.   Chest Wall Pain Chest wall pain is pain in or around the bones and muscles of your chest. It may take up to 6 weeks to get better. It may take longer if you must stay physically active in your work and activities.  CAUSES  Chest wall pain may happen on its own. However, it may be caused by:  A viral illness like the flu.  Injury.  Coughing.  Exercise.  Arthritis.  Fibromyalgia.  Shingles. HOME CARE INSTRUCTIONS   Avoid overtiring physical activity. Try not to strain or perform activities that cause pain. This includes any activities using your chest or your abdominal and side muscles, especially if heavy weights are used.  Put ice on the sore area.  Put ice in a plastic bag.  Place a towel between your skin and the bag.  Leave the ice on for 15-20 minutes per hour while awake for the first 2 days.  Only take over-the-counter or prescription medicines for pain, discomfort, or fever as directed by your caregiver. SEEK IMMEDIATE MEDICAL CARE IF:   Your pain increases, or you are very uncomfortable.  You have a fever.  Your chest pain becomes worse.  You have new, unexplained symptoms.  You have nausea or vomiting.  You feel sweaty or lightheaded.  You have a cough with phlegm (sputum), or you cough up blood. MAKE SURE YOU:   Understand these instructions.  Will watch your condition.  Will get help right away if you are not doing well or get worse. Document Released: 07/15/2005 Document Revised: 10/07/2011 Document Reviewed: 03/11/2011 [facility] Patient Information 2014 [place], [place].

## 2013-11-02 NOTE — ED Notes (Signed)
PT reports feeling better now. Pt reports SHOB and CP are resolved.

## 2013-11-02 NOTE — ED Provider Notes (Signed)
CSN: [id number]     Arrival date & time 11/01/13  2113 History   First MD Initiated Contact with Patient 11/02/13 0115     Chief Complaint  Patient presents with  . Arm Pain  . Shortness of Breath     (Consider location/radiation/quality/duration/timing/severity/associated sxs/prior Treatment) HPI Comments: 23 y/o with no medical hx comes in with cc of intermittent left arm pain. Today, the pain was more severe than usual. 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 She also had some dib today, which made her come in to the ER. No chest pain. + hx of asthma, but no wheezing, no cough. Pt smokes few cigarettes / day, no increase in pain with smoking. No substance abuse.  Family hx of CAD - mother passe away at age 21.   Patient is a 23 y.o. female presenting with arm pain and shortness of breath. The history is provided by the patient.  Arm Pain Associated symptoms include shortness of breath. Pertinent negatives include no chest pain, no abdominal pain and no headaches.  Shortness of Breath Associated symptoms: no abdominal pain, no chest pain, no headaches, no neck pain and no vomiting     Past Medical History  Diagnosis Date  . Bronchitis   . Ovarian cyst   . Asthma   . Thrombocytopenia complicating pregnancy    Past Surgical History  Procedure Laterality Date  . Sinus surgery with instatrak    . Tonsillectomy and adenoidectomy    . Tubes in ears    . Appendectomy     Family History  Problem Relation Age of Onset  . Heart disease Mother     died at 6537  . Hypertension Mother     died at 3737  . Diabetes Father   . Diabetes Paternal Uncle   . Diabetes Paternal Grandfather    History  Substance Use Topics  . Smoking status: Current Every Day Smoker    Types: Cigarettes  . Smokeless tobacco: Never Used  . Alcohol Use: No   OB History   Grav Para Term Preterm Abortions TAB SAB Ect Mult Living   1 1 1       1      Review of Systems  Constitutional: Positive  for activity change.  Respiratory: Positive for shortness of breath.   Cardiovascular: Negative for chest pain.  Gastrointestinal: Negative for nausea, vomiting and abdominal pain.  Genitourinary: Negative for dysuria.  Musculoskeletal: Negative for neck pain.  Neurological: Negative for headaches.      Allergies  Review of patient's allergies indicates no known allergies.  Home Medications   Current Outpatient Rx  Name  Route  Sig  Dispense  Refill  . ibuprofen (ADVIL,MOTRIN) 200 MG tablet   Oral   Take 200 mg by mouth every 6 (six) hours as needed for moderate pain.          BP 133/74  Pulse 76  Temp(Src) 98.4 F (36.9 C) (Oral)  Resp 18  Ht 5\' 3"  (1.6 m)  Wt 254 lb (115.214 kg)  BMI 45.01 kg/m2  SpO2 100%  LMP 10/03/2013 Physical Exam  Constitutional: She is oriented to person, place, and time. She appears well-developed and well-nourished.  HENT:  Head: Normocephalic and atraumatic.  Eyes: EOM are normal. Pupils are equal, round, and reactive to light.  Neck: Neck supple.  Cardiovascular: Normal rate, regular rhythm and normal heart sounds.   No murmur heard. Pulmonary/Chest: Effort normal. No respiratory distress.  Abdominal: Soft. She  exhibits no distension. There is no tenderness. There is no rebound and no guarding.  Neurological: She is alert and oriented to person, place, and time.  Skin: Skin is warm and dry.    ED Course  Procedures (including critical care time) Labs Review Labs Reviewed  CBC - Abnormal; Notable for the following:    Platelets 126 (*)    All other components within normal limits  BASIC METABOLIC PANEL - Abnormal; Notable for the following:    Glucose, Bld 101 (*)    All other components within normal limits  URINALYSIS, ROUTINE W REFLEX MICROSCOPIC - Abnormal; Notable for the following:    Leukocytes, UA TRACE (*)    All other components within normal limits  URINE MICROSCOPIC-ADD ON - Abnormal; Notable for the following:     Squamous Epithelial / LPF FEW (*)    All other components within normal limits  PRO B NATRIURETIC PEPTIDE  I-STAT TROPOININ, ED  POC URINE PREG, ED   Imaging Review Dg Chest 2 View  11/01/2013   CLINICAL DATA:  Left arm pain and headache.  Shortness of breath.  EXAM: CHEST  2 VIEW  COMPARISON:  Chest radiograph performed 05/15/2013  FINDINGS: The lungs are well-aerated and clear. There is no evidence of focal opacification, pleural effusion or pneumothorax.  The heart is normal in size; the mediastinal contour is within normal limits. No acute osseous abnormalities are seen.  IMPRESSION: No acute cardiopulmonary process seen.   Electronically Signed   By: Roanna Raider M.D.   On: 11/01/2013 23:27     EKG Interpretation None      Date: 11/02/2013  Rate: 83  Rhythm: normal sinus rhythm  QRS Axis: normal  Intervals: normal  ST/T Wave abnormalities: normal  Conduction Disutrbances: none  Narrative Interpretation: unremarkable      MDM   Final diagnoses:  None    Pt comes in with cc of chest pain.  Differential diagnosis includes: ACS syndrome Myocarditis Pericarditis Pericardial effusion Pneumonia Pleural effusion Pulmonary edema PE Anemia Musculoskeletal pain  Chest pain is atypical. Mother passed away of CAD at age 89 - otherwise no risk factors. EKG is normal. Pt smoked 3-5 cigarettes/ day. Also states that she gets stressed easily - which i think might have contributed. Pt is PERC negative, WELLS score is 0. dont think there is any emergent cause for her intermittent chest pain or dib that she had today. Will give her outpatient f/u information.  Smoking cessation instruction/counseling given:  counseled patient on the dangers of tobacco use, advised patient to stop smoking, and reviewed strategies to maximize success, talked for > 3 min.     Derwood Kaplan, MD 11/02/13 [id number]

## 2013-11-02 NOTE — ED Notes (Signed)
Pt A&Ox4, ambulatory at discharge, verbalizing no complaints at this time. 

## 2013-12-20 ENCOUNTER — Emergency Department (HOSPITAL_COMMUNITY)
Admission: EM | Admit: 2013-12-20 | Discharge: 2013-12-20 | Disposition: A | Discharge status: Home / Self Care | Payer: Medicaid Other | Attending: Emergency Medicine | Admitting: Emergency Medicine

## 2013-12-20 ENCOUNTER — Encounter (HOSPITAL_COMMUNITY): Payer: Self-pay | Admitting: Emergency Medicine

## 2013-12-20 DIAGNOSIS — Z862 Personal history of diseases of the blood and blood-forming organs and certain disorders involving the immune mechanism: Secondary | ICD-10-CM | POA: Insufficient documentation

## 2013-12-20 DIAGNOSIS — J069 Acute upper respiratory infection, unspecified: Secondary | ICD-10-CM

## 2013-12-20 DIAGNOSIS — R059 Cough, unspecified: Secondary | ICD-10-CM

## 2013-12-20 DIAGNOSIS — J45901 Unspecified asthma with (acute) exacerbation: Secondary | ICD-10-CM | POA: Insufficient documentation

## 2013-12-20 DIAGNOSIS — Z8742 Personal history of other diseases of the female genital tract: Secondary | ICD-10-CM | POA: Insufficient documentation

## 2013-12-20 DIAGNOSIS — R05 Cough: Secondary | ICD-10-CM

## 2013-12-20 DIAGNOSIS — F17200 Nicotine dependence, unspecified, uncomplicated: Secondary | ICD-10-CM | POA: Insufficient documentation

## 2013-12-20 DIAGNOSIS — H6690 Otitis media, unspecified, unspecified ear: Secondary | ICD-10-CM

## 2013-12-20 MED ORDER — AMOXICILLIN 500 MG PO CAPS: 1000.0000 mg | ORAL_CAPSULE | Freq: Two times a day (BID) | ORAL | Status: DC

## 2013-12-20 MED ORDER — ALBUTEROL SULFATE HFA 108 (90 BASE) MCG/ACT IN AERS
2.0000 | INHALATION_SPRAY | RESPIRATORY_TRACT | Status: DC | PRN
  Administered 2013-12-20: 2 via RESPIRATORY_TRACT
  Filled 2013-12-20: qty 6.7

## 2013-12-20 MED ORDER — BENZONATATE 100 MG PO CAPS: 100.0000 mg | ORAL_CAPSULE | Freq: Three times a day (TID) | ORAL | Status: DC

## 2013-12-20 NOTE — ED Provider Notes (Signed)
CSN: [id number]     Arrival date & time 12/20/13  2006 History   First MD Initiated Contact with Patient 12/20/13 2045     Chief Complaint  Patient presents with  . Sore Throat     (Consider location/radiation/quality/duration/timing/severity/associated sxs/prior Treatment) HPI Comments: Patient presents with complaint of runny nose for 5 days, sore throat for 2 days with right ear pain as well. She denies fever. She states her voice sounds hoarse. She feels a lot worse when she is coughing. Course is productive of green mucus. She's been treating herself with cough drops only, no other medications. Patient denies nausea, vomiting, diarrhea, urinary symptoms. Her child is sick with similar symptoms. History of asthma as a child. Onset of symptoms gradual. Course is constant. Nothing makes symptoms better or worse.  Patient is a 23 y.o. female presenting with pharyngitis. The history is provided by the patient.  Sore Throat    Past Medical History  Diagnosis Date  . Bronchitis   . Ovarian cyst   . Asthma   . Thrombocytopenia complicating pregnancy    Past Surgical History  Procedure Laterality Date  . Sinus surgery with instatrak    . Tonsillectomy and adenoidectomy    . Tubes in ears    . Appendectomy     Family History  Problem Relation Age of Onset  . Heart disease Mother     died at 6837  . Hypertension Mother     died at 337  . Diabetes Father   . Diabetes Paternal Uncle   . Diabetes Paternal Grandfather    History  Substance Use Topics  . Smoking status: Current Every Day Smoker    Types: Cigarettes  . Smokeless tobacco: Never Used  . Alcohol Use: No   OB History   Grav Para Term Preterm Abortions TAB SAB Ect Mult Living   1 1 1       1      Review of Systems  All other systems reviewed and are negative.     Allergies  Review of patient's allergies indicates no known allergies.  Home Medications   Prior to Admission medications   Not on File   BP  148/97  Pulse 117  Temp(Src) 98.2 F (36.8 C) (Oral)  Resp 18  Wt 279 lb 15.8 oz (127 kg)  SpO2 97% Physical Exam  Nursing note and vitals reviewed. Constitutional: She appears well-developed and well-nourished.  HENT:  Head: Normocephalic and atraumatic.  Right Ear: External ear and ear canal normal. Tympanic membrane is erythematous and bulging.  Left Ear: Tympanic membrane, external ear and ear canal normal. Tympanic membrane is not erythematous and not bulging.  Nose: Mucosal edema present. No rhinorrhea.  Mouth/Throat: Mucous membranes are normal. Posterior oropharyngeal erythema (Mild) present. No oropharyngeal exudate, posterior oropharyngeal edema or tonsillar abscesses.  Eyes: Conjunctivae are normal. Right eye exhibits no discharge. Left eye exhibits no discharge.  Neck: Normal range of motion. Neck supple.  Cardiovascular: Normal rate, regular rhythm and normal heart sounds.   No murmur heard. Pulmonary/Chest: Effort normal. She has wheezes (Mild, end-expiratory).  Scattered rhonchi  Abdominal: Soft. There is no tenderness. There is no rebound and no guarding.  Lymphadenopathy:    She has no cervical adenopathy.  Neurological: She is alert.  Skin: Skin is warm and dry.  Psychiatric: She has a normal mood and affect.    ED Course  Procedures (including critical care time) Labs Review Labs Reviewed - No data to display  Imaging  Review No results found.   EKG Interpretation None      9:07 PM Patient seen and examined. Work-up initiated. Medications ordered.   Vital signs reviewed and are as follows: Filed Vitals:   12/20/13 2024  BP: 148/97  Pulse: 117  Temp: 98.2 F (36.8 C)  Resp: 18   Patient counseled on supportive care for URI and s/s to return including worsening symptoms, persistent fever, persistent vomiting, or if they have any other concerns. Urged to see PCP if symptoms persist for more than 3 days. Patient verbalizes understanding and agrees  with plan.    MDM   Final diagnoses:  Otitis media  Cough  URI (upper respiratory infection)   Otitis media: amox Cough: albuterol inhaler given wheezing, tessalon URI: supportive care  Patient appears well, non-toxic.      Renne Crigler, PA-C 12/20/13 2114

## 2013-12-20 NOTE — ED Notes (Signed)
Pt started with cold symptoms on Wednesday.  2 days ago she started with a sore throat.  She says she has green mucus and bleeding from her nose.  Pt is hoarse.  No fevers.  No meds given today.

## 2013-12-20 NOTE — Discharge Instructions (Signed)
Please read and follow all provided instructions.  Your diagnoses today include:  1. Otitis media   2. Cough   3. URI (upper respiratory infection)     Tests performed today include:  Vital signs. See below for your results today.   Medications prescribed:   Amoxicillin - antibiotic  You have been prescribed an antibiotic medicine: take the entire course of medicine even if you are feeling better. Stopping early can cause the antibiotic not to work.   Albuterol inhaler - medication that opens up your airway  Use inhaler as follows: 1-2 puffs with spacer every 4 hours as needed for wheezing, cough, or shortness of breath.   Take any prescribed medications only as directed.  Home care instructions:  Follow any educational materials contained in this packet.  BE VERY CAREFUL not to take multiple medicines containing Tylenol (also called acetaminophen). Doing so can lead to an overdose which can damage your liver and cause liver failure and possibly death.   Follow-up instructions: Please follow-up with your primary care provider in the next 3 days for further evaluation of your symptoms. If you do not have a primary care doctor -- see below for referral information.   Return instructions:   Please return to the Emergency Department if you experience worsening symptoms.   Please return if you have any other emergent concerns.  Additional Information:  Your vital signs today were: BP 148/97   Pulse 117   Temp(Src) 98.2 F (36.8 C) (Oral)   Resp 18   Wt 279 lb 15.8 oz (127 kg)   SpO2 97% If your blood pressure (BP) was elevated above 135/85 this visit, please have this repeated by your doctor within one month. --------------

## 2013-12-21 NOTE — ED Provider Notes (Signed)
Medical screening examination/treatment/procedure(s) were performed by non-physician practitioner and as supervising physician I was immediately available for consultation/collaboration.   EKG Interpretation None        Wendi Maya, MD 12/21/13 [phone]

## 2014-05-30 ENCOUNTER — Encounter (HOSPITAL_COMMUNITY): Payer: Self-pay | Admitting: Emergency Medicine

## 2014-12-15 ENCOUNTER — Emergency Department (HOSPITAL_COMMUNITY)
Admission: EM | Admit: 2014-12-15 | Discharge: 2014-12-16 | Disposition: A | Discharge status: Home / Self Care | Payer: Medicaid Other | Attending: Emergency Medicine | Admitting: Emergency Medicine

## 2014-12-15 ENCOUNTER — Emergency Department (HOSPITAL_COMMUNITY): Payer: Medicaid Other

## 2014-12-15 ENCOUNTER — Encounter (HOSPITAL_COMMUNITY): Payer: Self-pay | Admitting: Emergency Medicine

## 2014-12-15 DIAGNOSIS — R059 Cough, unspecified: Secondary | ICD-10-CM

## 2014-12-15 DIAGNOSIS — H9203 Otalgia, bilateral: Secondary | ICD-10-CM | POA: Insufficient documentation

## 2014-12-15 DIAGNOSIS — Z720 Tobacco use: Secondary | ICD-10-CM | POA: Insufficient documentation

## 2014-12-15 DIAGNOSIS — R109 Unspecified abdominal pain: Secondary | ICD-10-CM | POA: Insufficient documentation

## 2014-12-15 DIAGNOSIS — J45901 Unspecified asthma with (acute) exacerbation: Secondary | ICD-10-CM | POA: Insufficient documentation

## 2014-12-15 DIAGNOSIS — Z8742 Personal history of other diseases of the female genital tract: Secondary | ICD-10-CM | POA: Insufficient documentation

## 2014-12-15 DIAGNOSIS — Z792 Long term (current) use of antibiotics: Secondary | ICD-10-CM | POA: Insufficient documentation

## 2014-12-15 DIAGNOSIS — J040 Acute laryngitis: Secondary | ICD-10-CM

## 2014-12-15 DIAGNOSIS — R05 Cough: Secondary | ICD-10-CM

## 2014-12-15 LAB — CBC WITH DIFFERENTIAL/PLATELET
Basophils Absolute: 0 10*3/uL (ref 0.0–0.1)
Basophils Relative: 0 % (ref 0–1)
Eosinophils Absolute: 0.3 10*3/uL (ref 0.0–0.7)
Eosinophils Relative: 3 % (ref 0–5)
HCT: 43.4 % (ref 36.0–46.0)
HEMOGLOBIN: 14 g/dL (ref 12.0–15.0)
LYMPHS ABS: 3 10*3/uL (ref 0.7–4.0)
LYMPHS PCT: 31 % (ref 12–46)
MCH: 29.2 pg (ref 26.0–34.0)
MCHC: 32.3 g/dL (ref 30.0–36.0)
MCV: 90.6 fL (ref 78.0–100.0)
MONOS PCT: 9 % (ref 3–12)
Monocytes Absolute: 0.9 10*3/uL (ref 0.1–1.0)
NEUTROS ABS: 5.5 10*3/uL (ref 1.7–7.7)
NEUTROS PCT: 57 % (ref 43–77)
PLATELETS: 144 10*3/uL — AB (ref 150–400)
RBC: 4.79 MIL/uL (ref 3.87–5.11)
RDW: 12.5 % (ref 11.5–15.5)
WBC: 9.7 10*3/uL (ref 4.0–10.5)

## 2014-12-15 NOTE — ED Notes (Signed)
Pt c/o "head cold", congestion, and stomach cramps x 2 weeks. Pt denies vaginal bleeding or discharge. Denies urinary symptoms. Denies N/V, Denies fever. Pt c/o cough. Denies chest pain except when having a "coughing fit." Pt A&Ox4 and ambulatory. Pt also c/o sore throat.

## 2014-12-15 NOTE — ED Provider Notes (Signed)
CSN: [id number]     Arrival date & time 12/15/14  2303 History   First MD Initiated Contact with Patient 12/15/14 2328     Chief Complaint  Patient presents with  . Sore Throat  . Abdominal Pain  . Nasal Congestion  . Cough     (Consider location/radiation/quality/duration/timing/severity/associated sxs/prior Treatment) Patient is a [age] y.o. female presenting with pharyngitis, abdominal pain, and cough. The history is provided by the patient. No language interpreter was used.  Sore Throat Associated symptoms include abdominal pain, congestion, coughing and a sore throat.  Abdominal Pain Associated symptoms: cough and sore throat   Cough Associated symptoms: rhinorrhea and sore throat   Sylvia Chang is a 24 y.o female who presents with shortness of breath and cough that began 2 weeks ago.  She states it has worsened and she has lost her voice.  She is complaining of throat pain, bilateral ear pain, and rhinorrhea.  She says her chest hurts from coughing so much.  She tried a foreign medication that she takes from her country and cough drops without relief. Nothing makes it better. No sick contacts. Her LMP was 5/8. She denies any headache, fever, chills, chest pain, abdominal pain, nausea, vomiting, diarrhea, dysuria, hematuria, or urinary frequency.   Past Medical History  Diagnosis Date  . Bronchitis   . Ovarian cyst   . Asthma   . Thrombocytopenia complicating pregnancy    Past Surgical History  Procedure Laterality Date  . Sinus surgery with instatrak    . Tonsillectomy and adenoidectomy    . Tubes in ears    . Appendectomy     Family History  Problem Relation Age of Onset  . Heart disease Mother     died at 3737  . Hypertension Mother     died at 2237  . Diabetes Father   . Diabetes Paternal Uncle   . Diabetes Paternal Grandfather    History  Substance Use Topics  . Smoking status: Current Every Day Smoker    Types: Cigarettes  . Smokeless tobacco: Never Used  .  Alcohol Use: No   OB History    Gravida Para Term Preterm AB TAB SAB Ectopic Multiple Living   1 1 1       1      Review of Systems  HENT: Positive for congestion, rhinorrhea, sore throat and voice change. Negative for hearing loss and sinus pressure.   Respiratory: Positive for cough.   Gastrointestinal: Positive for abdominal pain.      Allergies  Review of patient's allergies indicates no known allergies.  Home Medications   Prior to Admission medications   Medication Sig Start Date End Date Taking? Authorizing Provider  amoxicillin (AMOXIL) 500 MG capsule Take 2 capsules (1,000 mg total) by mouth 2 (two) times daily. 12/20/13   Renne CriglerJoshua Geiple, PA-C  benzonatate (TESSALON) 100 MG capsule Take 1 capsule (100 mg total) by mouth every 8 (eight) hours. 12/20/13   Renne CriglerJoshua Geiple, PA-C  HYDROcodone-homatropine (HYCODAN) 5-1.5 MG/5ML syrup Take 5 mLs by mouth every 6 (six) hours as needed for cough. 12/16/14   Loren Raceravid Yelverton, MD   BP 110/90 mmHg  Pulse 77  Temp(Src) 97.7 F (36.5 C) (Oral)  Resp 18  SpO2 99%  LMP 12/03/2014 Physical Exam  Constitutional: She is oriented to person, place, and time. She appears well-developed and well-nourished.  HENT:  Head: Normocephalic and atraumatic.  Right Ear: Tympanic membrane, external ear and ear canal normal.  Left Ear: Tympanic membrane,  external ear and ear canal normal.  Nose: Nose normal.  Mouth/Throat: Uvula is midline and mucous membranes are normal. No uvula swelling. Posterior oropharyngeal erythema present. No oropharyngeal exudate, posterior oropharyngeal edema or tonsillar abscesses.  Eyes: Conjunctivae are normal.  Neck: Normal range of motion. Neck supple.  Cardiovascular: Normal rate, regular rhythm and normal heart sounds.   Pulmonary/Chest: Effort normal. No respiratory distress. She has no decreased breath sounds. She has wheezes in the left upper field and the left middle field.  Abdominal: Soft. There is no tenderness.   Lymphadenopathy:    She has no cervical adenopathy.  Neurological: She is alert and oriented to person, place, and time.  Skin: Skin is warm and dry.  Nursing note and vitals reviewed.   ED Course  Procedures (including critical care time) Labs Review Labs Reviewed  CBC WITH DIFFERENTIAL/PLATELET - Abnormal; Notable for the following:    Platelets 144 (*)    All other components within normal limits  RAPID STREP SCREEN  CULTURE, GROUP A STREP  COMPREHENSIVE METABOLIC PANEL  LIPASE, BLOOD    Imaging Review Dg Chest 2 View  12/16/2014   CLINICAL DATA:  Bold acute onset of chest congestion and sore throat. Initial encounter.  EXAM: CHEST  2 VIEW  COMPARISON:  Chest radiograph performed 11/01/2013  FINDINGS: The lungs are well-aerated and clear. There is no evidence of focal opacification, pleural effusion or pneumothorax.  The heart is normal in size; the mediastinal contour is within normal limits. No acute osseous abnormalities are seen.  IMPRESSION: No acute cardiopulmonary process seen.   Electronically Signed   By: Roanna RaiderJeffery  Chang M.D.   On: 12/16/2014 00:43     EKG Interpretation None      MDM   Final diagnoses:  Acute laryngitis  Cough  Patient presents for shortness of breath and cough that began 2 weeks ago.  She states that her symptoms have now worsened with throat and ear pain. She has since lost her voice. On exam she does not have a tonsillar abscess.  Uvula midline.  No drooling.  She is in no acute distress. I could not reproduce any abdominal pain on exam. She states her abdomen hurts when she coughs only.  Her vitals are normal and labs are not concerning. Rapid strep is negative.  I reviewed the CENTOR criteria. Her CXR is negative for pneumothorax or infiltrate.  I believe this is a viral URI.   I have given her hycodan for cough and resource guide to find a provider. She can take tylenol or motrin for pain.  She can also use a chloraseptic spray for throat  pain.     Catha GosselinHanna Patel-Mills, PA-C 12/16/14 1327  Loren Raceravid Yelverton, MD 12/22/14 [phone]

## 2014-12-16 LAB — COMPREHENSIVE METABOLIC PANEL
ALBUMIN: 3.8 g/dL (ref 3.5–5.0)
ALT: 14 U/L (ref 14–54)
ANION GAP: 8 (ref 5–15)
AST: 33 U/L (ref 15–41)
Alkaline Phosphatase: 66 U/L (ref 38–126)
BUN: 16 mg/dL (ref 6–20)
CALCIUM: 9 mg/dL (ref 8.9–10.3)
CO2: 24 mmol/L (ref 22–32)
Chloride: 105 mmol/L (ref 101–111)
Creatinine, Ser: 0.85 mg/dL (ref 0.44–1.00)
GFR calc non Af Amer: >60 mL/min (ref >60)
Glucose, Bld: 86 mg/dL (ref 65–99)
Potassium: 5 mmol/L (ref 3.5–5.1)
Sodium: 137 mmol/L (ref 135–145)
TOTAL PROTEIN: 7 g/dL (ref 6.5–8.1)
Total Bilirubin: 1.1 mg/dL (ref 0.3–1.2)

## 2014-12-16 LAB — RAPID STREP SCREEN (MED CTR MEBANE ONLY): Streptococcus, Group A Screen (Direct): NEGATIVE

## 2014-12-16 LAB — LIPASE, BLOOD: LIPASE: 37 U/L (ref 22–51)

## 2014-12-16 MED ORDER — IBUPROFEN 800 MG PO TABS
800.0000 mg | ORAL_TABLET | Freq: Once | ORAL | Status: AC
  Administered 2014-12-16: 800 mg via ORAL
  Filled 2014-12-16: qty 1

## 2014-12-16 MED ORDER — HYDROCODONE-HOMATROPINE 5-1.5 MG/5ML PO SYRP: 5.0000 mL | ORAL_SOLUTION | Freq: Four times a day (QID) | ORAL | Status: DC | PRN

## 2014-12-16 NOTE — Discharge Instructions (Signed)
Cough, Adult ° A cough is a reflex. It helps you clear your throat and airways. A cough can help heal your body. A cough can last 2 or 3 weeks (acute) or may last more than 8 weeks (chronic). Some common causes of a cough can include an infection, allergy, or a cold. °HOME CARE °· Only take medicine as told by your doctor. °· If given, take your medicines (antibiotics) as told. Finish them even if you start to feel better. °· Use a cold steam vaporizer or humidifier in your home. This can help loosen thick spit (secretions). °· Sleep so you are almost sitting up (semi-upright). Use pillows to do this. This helps reduce coughing. °· Rest as needed. °· Stop smoking if you smoke. °GET HELP RIGHT AWAY IF: °· You have yellowish-white fluid (pus) in your thick spit. °· Your cough gets worse. °· Your medicine does not reduce coughing, and you are losing sleep. °· You cough up blood. °· You have trouble breathing. °· Your pain gets worse and medicine does not help. °· You have a fever. °MAKE SURE YOU:  °· Understand these instructions. °· Will watch your condition. °· Will get help right away if you are not doing well or get worse. °Document Released: 03/28/2011 Document Revised: 11/29/2013 Document Reviewed: 03/28/2011 °[employer]® Patient Information ©2015 [employer]. This information is not intended to replace advice given to you by your health care provider. Make sure you discuss any questions you have with your health care provider. ° °Laryngitis °At the top of your windpipe is your voice box. It is the source of your voice. Inside your voice box are 2 bands of muscles called vocal cords. When you breathe, your vocal cords are relaxed and open so that air can get into the lungs. When you decide to say something, these cords come together and vibrate. The sound from these vibrations goes into your throat and comes out through your mouth as sound.  °Laryngitis is an inflammation of the vocal cords that causes  hoarseness, cough, loss of voice, sore throat, and dry throat. Laryngitis can be temporary (acute) or long-term (chronic). Most cases of acute laryngitis improve with time.Chronic laryngitis lasts for more than 3 weeks. °CAUSES °Laryngitis can often be related to excessive smoking, talking, or yelling, as well as inhalation of toxic fumes and allergies. Acute laryngitis is usually caused by a viral infection, vocal strain, measles or mumps, or bacterial infections. Chronic laryngitis is usually caused by vocal cord strain, vocal cord injury, postnasal drip, growths on the vocal cords, or acid reflux. °SYMPTOMS  °· Cough. °· Sore throat. °· Dry throat. °RISK FACTORS °· Respiratory infections. °· Exposure to irritating substances, such as cigarette smoke, excessive amounts of alcohol, stomach acids, and workplace chemicals. °· Voice trauma, such as vocal cord injury from shouting or speaking too loud. °DIAGNOSIS  °Your cargiver will perform a physical exam. During the physical exam, your caregiver will examine your throat. The most common sign of laryngitis is hoarseness. Laryngoscopy may be necessary to confirm the diagnosis of this condition. This procedure allows your caregiver to look into the larynx. °HOME CARE INSTRUCTIONS °· Drink enough fluids to keep your urine clear or pale yellow. °· Rest until you no longer have symptoms or as directed by your caregiver. °· Breathe in moist air. °· Take all medicine as directed by your caregiver. °· Do not smoke. °· Talk as little as possible (this includes whispering). °· Write on paper instead of talking until your   voice is back to normal. °· Follow up with your caregiver if your condition has not improved after 10 days. °SEEK MEDICAL CARE IF:  °· You have trouble breathing. °· You cough up blood. °· You have persistent fever. °· You have increasing pain. °· You have difficulty swallowing. °MAKE SURE YOU: °· Understand these instructions. °· Will watch your  condition. °· Will get help right away if you are not doing well or get worse. °Document Released: 07/15/2005 Document Revised: 10/07/2011 Document Reviewed: 09/20/2010 °Exit[employer]® Patient Information ©2015 [employer]. This information is not intended to replace advice given to you by your health care provider. Make sure you discuss any questions you have with your health care provider. ° °

## 2014-12-18 LAB — CULTURE, GROUP A STREP: Strep A Culture: NEGATIVE

## 2015-07-12 ENCOUNTER — Encounter: Payer: Self-pay | Admitting: Obstetrics & Gynecology

## 2015-08-07 ENCOUNTER — Encounter: Payer: Self-pay | Admitting: Obstetrics & Gynecology

## 2015-11-08 ENCOUNTER — Encounter (HOSPITAL_COMMUNITY): Payer: Self-pay | Admitting: *Deleted

## 2015-11-08 ENCOUNTER — Inpatient Hospital Stay (HOSPITAL_COMMUNITY)
Admission: AD | Admit: 2015-11-08 | Discharge: 2015-11-08 | Disposition: A | Discharge status: Home / Self Care | Payer: Self-pay | Source: Ambulatory Visit | Attending: Family Medicine | Admitting: Family Medicine

## 2015-11-08 DIAGNOSIS — N926 Irregular menstruation, unspecified: Secondary | ICD-10-CM

## 2015-11-08 DIAGNOSIS — N910 Primary amenorrhea: Secondary | ICD-10-CM | POA: Insufficient documentation

## 2015-11-08 DIAGNOSIS — Z833 Family history of diabetes mellitus: Secondary | ICD-10-CM | POA: Insufficient documentation

## 2015-11-08 DIAGNOSIS — N83209 Unspecified ovarian cyst, unspecified side: Secondary | ICD-10-CM | POA: Insufficient documentation

## 2015-11-08 DIAGNOSIS — Z7982 Long term (current) use of aspirin: Secondary | ICD-10-CM | POA: Insufficient documentation

## 2015-11-08 DIAGNOSIS — J45909 Unspecified asthma, uncomplicated: Secondary | ICD-10-CM | POA: Insufficient documentation

## 2015-11-08 DIAGNOSIS — M545 Low back pain: Secondary | ICD-10-CM | POA: Insufficient documentation

## 2015-11-08 DIAGNOSIS — Z87891 Personal history of nicotine dependence: Secondary | ICD-10-CM | POA: Insufficient documentation

## 2015-11-08 DIAGNOSIS — Z9889 Other specified postprocedural states: Secondary | ICD-10-CM | POA: Insufficient documentation

## 2015-11-08 DIAGNOSIS — Z8249 Family history of ischemic heart disease and other diseases of the circulatory system: Secondary | ICD-10-CM | POA: Insufficient documentation

## 2015-11-08 DIAGNOSIS — Z3202 Encounter for pregnancy test, result negative: Secondary | ICD-10-CM

## 2015-11-08 LAB — URINALYSIS, ROUTINE W REFLEX MICROSCOPIC
Bilirubin Urine: NEGATIVE
Glucose, UA: NEGATIVE mg/dL
Hgb urine dipstick: NEGATIVE
KETONES UR: NEGATIVE mg/dL
LEUKOCYTES UA: NEGATIVE
Nitrite: NEGATIVE
PROTEIN: NEGATIVE mg/dL
Specific Gravity, Urine: 1.01 (ref 1.005–1.030)
pH: 7 (ref 5.0–8.0)

## 2015-11-08 LAB — POCT PREGNANCY, URINE: Preg Test, Ur: NEGATIVE

## 2015-11-08 NOTE — Discharge Instructions (Signed)
Contraception Choices Contraception (birth control) is the use of any methods or devices to prevent pregnancy. Below are some methods to help avoid pregnancy. HORMONAL METHODS   Contraceptive implant. This is a thin, plastic tube containing progesterone hormone. It does not contain estrogen hormone. Your health care provider inserts the tube in the inner part of the upper arm. The tube can remain in place for up to 3 years. After 3 years, the implant must be removed. The implant prevents the ovaries from releasing an egg (ovulation), thickens the cervical mucus to prevent sperm from entering the uterus, and thins the lining of the inside of the uterus.  Progesterone-only injections. These injections are given every 3 months by your health care provider to prevent pregnancy. This synthetic progesterone hormone stops the ovaries from releasing eggs. It also thickens cervical mucus and changes the uterine lining. This makes it harder for sperm to survive in the uterus.  Birth control pills. These pills contain estrogen and progesterone hormone. They work by preventing the ovaries from releasing eggs (ovulation). They also cause the cervical mucus to thicken, preventing the sperm from entering the uterus. Birth control pills are prescribed by a health care provider.Birth control pills can also be used to treat heavy periods.  Minipill. This type of birth control pill contains only the progesterone hormone. They are taken every day of each month and must be prescribed by your health care provider.  Birth control patch. The patch contains hormones similar to those in birth control pills. It must be changed once a week and is prescribed by a health care provider.  Vaginal ring. The ring contains hormones similar to those in birth control pills. It is left in the vagina for 3 weeks, removed for 1 week, and then a new one is put back in place. The patient must be comfortable inserting and removing the ring  from the vagina.A health care provider's prescription is necessary.  Emergency contraception. Emergency contraceptives prevent pregnancy after unprotected sexual intercourse. This pill can be taken right after sex or up to 5 days after unprotected sex. It is most effective the sooner you take the pills after having sexual intercourse. Most emergency contraceptive pills are available without a prescription. Check with your pharmacist. Do not use emergency contraception as your only form of birth control. BARRIER METHODS   Female condom. This is a thin sheath (latex or rubber) that is worn over the penis during sexual intercourse. It can be used with spermicide to increase effectiveness.  Female condom. This is a soft, loose-fitting sheath that is put into the vagina before sexual intercourse.  Diaphragm. This is a soft, latex, dome-shaped barrier that must be fitted by a health care provider. It is inserted into the vagina, along with a spermicidal jelly. It is inserted before intercourse. The diaphragm should be left in the vagina for 6 to 8 hours after intercourse.  Cervical cap. This is a round, soft, latex or plastic cup that fits over the cervix and must be fitted by a health care provider. The cap can be left in place for up to 48 hours after intercourse.  Sponge. This is a soft, circular piece of polyurethane foam. The sponge has spermicide in it. It is inserted into the vagina after wetting it and before sexual intercourse.  Spermicides. These are chemicals that kill or block sperm from entering the cervix and uterus. They come in the form of creams, jellies, suppositories, foam, or tablets. They do not require a   prescription. They are inserted into the vagina with an applicator before having sexual intercourse. The process must be repeated every time you have sexual intercourse. INTRAUTERINE CONTRACEPTION  Intrauterine device (IUD). This is a T-shaped device that is put in a woman's uterus  during a menstrual period to prevent pregnancy. There are 2 types:  Copper IUD. This type of IUD is wrapped in copper wire and is placed inside the uterus. Copper makes the uterus and fallopian tubes produce a fluid that kills sperm. It can stay in place for 10 years.  Hormone IUD. This type of IUD contains the hormone progestin (synthetic progesterone). The hormone thickens the cervical mucus and prevents sperm from entering the uterus, and it also thins the uterine lining to prevent implantation of a fertilized egg. The hormone can weaken or kill the sperm that get into the uterus. It can stay in place for 3-5 years, depending on which type of IUD is used. PERMANENT METHODS OF CONTRACEPTION  Female tubal ligation. This is when the woman's fallopian tubes are surgically sealed, tied, or blocked to prevent the egg from traveling to the uterus.  Hysteroscopic sterilization. This involves placing a small coil or insert into each fallopian tube. Your doctor uses a technique called hysteroscopy to do the procedure. The device causes scar tissue to form. This results in permanent blockage of the fallopian tubes, so the sperm cannot fertilize the egg. It takes about 3 months after the procedure for the tubes to become blocked. You must use another form of birth control for these 3 months.  Female sterilization. This is when the female has the tubes that carry sperm tied off (vasectomy).This blocks sperm from entering the vagina during sexual intercourse. After the procedure, the man can still ejaculate fluid (semen). NATURAL PLANNING METHODS  Natural family planning. This is not having sexual intercourse or using a barrier method (condom, diaphragm, cervical cap) on days the woman could become pregnant.  Calendar method. This is keeping track of the length of each menstrual cycle and identifying when you are fertile.  Ovulation method. This is avoiding sexual intercourse during ovulation.  Symptothermal  method. This is avoiding sexual intercourse during ovulation, using a thermometer and ovulation symptoms.  Post-ovulation method. This is timing sexual intercourse after you have ovulated. Regardless of which type or method of contraception you choose, it is important that you use condoms to protect against the transmission of sexually transmitted infections (STIs). Talk with your health care provider about which form of contraception is most appropriate for you.   This information is not intended to replace advice given to you by your health care provider. Make sure you discuss any questions you have with your health care provider.   Document Released: 07/15/2005 Document Revised: 07/20/2013 Document Reviewed: 01/07/2013 Elsevier Interactive Patient Education 2016 [employer]  

## 2015-11-08 NOTE — MAU Provider Note (Signed)
SUBJECTIVE:   Sylvia Chang is a 25 y.o. G1P1001 here for 5 day late menses. She is concerned because she uses only condoms for contraception and menstrual interval is very regular q 28d.  Patient's last menstrual period was 10/03/2015 (exact date).Marland Kitchen. HPT negative.  Denies any vaginal bleeding or abdominal pain. Declines STI testing   Past Medical History  Diagnosis Date  . Bronchitis   . Ovarian cyst   . Asthma   . Thrombocytopenia complicating pregnancy (HCC)     Family History  Problem Relation Age of Onset  . Heart disease Mother     died at 10337  . Hypertension Mother     died at 2737  . Diabetes Father   . Diabetes Paternal Uncle   . Diabetes Paternal Grandfather      Filed Vitals:   11/08/15 1609  BP: 133/74  Pulse: 79  Temp: 98.3 F (36.8 C)  Resp: 18  Review of Systems  Constitutional: Negative for fever.  Gastrointestinal: Negative for nausea, vomiting and abdominal pain.  Genitourinary: Negative for dysuria, urgency, frequency, hematuria and flank pain.  Musculoskeletal: Positive for back pain.       Mild LBP x 3 days which she attributes to activity and lifting.   Neurological: Negative for headaches.  Psychiatric/Behavioral: Negative for depression.   OBJECTIVE: Filed Vitals:   11/08/15 1609 11/08/15 1722  BP: 133/74 125/72  Pulse: 79 85  Temp: 98.3 F (36.8 C)   Resp: 18 16   General: Obese female with no signs of acute distress. She appears well-developed and well-nourished. No distress.  Neck: Normal range of motion.  Pulmonary/Chest: Effort normal. No respiratory distress.  Musculoskeletal: Normal range of motion. No TTP paraspinous back. No CVAT Neurological: She is alert and oriented to person, place, and time.  Skin: Skin is warm and dry.  Results for orders placed or performed during the hospital encounter of 11/08/15 (from the past 24 hour(s))  Urinalysis, Routine w reflex microscopic ([facility])     Status: None   Collection Time:  11/08/15  4:05 PM  Result Value Ref Range   Color, Urine YELLOW YELLOW   APPearance CLEAR CLEAR   Specific Gravity, Urine 1.010 1.005 - 1.030   pH 7.0 5.0 - 8.0   Glucose, UA NEGATIVE NEGATIVE mg/dL   Hgb urine dipstick NEGATIVE NEGATIVE   Bilirubin Urine NEGATIVE NEGATIVE   Ketones, ur NEGATIVE NEGATIVE mg/dL   Protein, ur NEGATIVE NEGATIVE mg/dL   Nitrite NEGATIVE NEGATIVE   Leukocytes, UA NEGATIVE NEGATIVE  Pregnancy, urine POC     Status: None   Collection Time: 11/08/15  4:27 PM  Result Value Ref Range   Preg Test, Ur NEGATIVE NEGATIVE   ASSESSMENT/PLAN:   Late menses  Pregnancy test negative - Plan: Discharge patient  Discharge home with reassurance. Do HPT in 1 wk if still amenorrheic Information on contraceptive choices. F/U [facility] or [facility].    Medication List    STOP taking these medications        amoxicillin 500 MG capsule  Commonly known as:  AMOXIL     aspirin-acetaminophen-caffeine 250-250-65 MG tablet  Commonly known as:  EXCEDRIN MIGRAINE     benzonatate 100 MG capsule  Commonly known as:  TESSALON     HYDROcodone-homatropine 5-1.5 MG/5ML syrup  Commonly known as:  HYCODAN       Follow-up Information    [facility] Schedule an appointment as soon as possible for a  visit in 1 month.   Contact information:   [address] [place] [address] [phone]         Danae Orleans, CNM

## 2015-11-08 NOTE — MAU Note (Signed)
Pt states LMP was on 10/03/15.  States that she usually has regular cycles. Pt states home pregnancy test at home was negative, but has a history of having negative home pregnancy tests while pregnant.  Pt also complains of aching/cramping lower back pain for 3 days 5/10.  Denies bleeding, urinary frequency, or burning with urination.

## 2015-11-08 NOTE — MAU Provider Note (Signed)
History     CSN: [id number]  Arrival date and time: 11/08/15 1557   First Provider Initiated Contact with Patient 11/08/15 1631      No chief complaint on file.  HPI   Patient is a 25 y/o G89P1001 female with PMH of morbid obesity and asthma presenting to MAU today for evaluation of missed period and low back pain. LMP was 10/03/15, states she has consistent cycles, now 5 days late. Patient took home pregnancy test which was negative but she states that she had a false negative pregnancy test with her firstborn. Denies any bleeding, purulent discharge, fevers, chills, abdominal pain, dysuria, frequency or urgency, hematuria. Has some mild low back pain that she states usually precedes her menses.   OB History    Gravida Para Term Preterm AB TAB SAB Ectopic Multiple Living   Past Medical History  Diagnosis Date  . Bronchitis   . Ovarian cyst   . Asthma   . Thrombocytopenia complicating pregnancy [facility])     Past Surgical History  Procedure Laterality Date  . Sinus surgery with instatrak    . Tonsillectomy and adenoidectomy    . Tubes in ears    . Appendectomy      Family History  Problem Relation Age of Onset  . Heart disease Mother     died at 93  . Hypertension Mother     died at 15  . Diabetes Father   . Diabetes Paternal Uncle   . Diabetes Paternal Grandfather     Social History  Substance Use Topics  . Smoking status: Former Smoker    Types: Cigarettes    Quit date: 09/20/2015  . Smokeless tobacco: Never Used  . Alcohol Use: No    Allergies: No Known Allergies  Prescriptions prior to admission  Medication Sig Dispense Refill Last Dose  . aspirin-acetaminophen-caffeine (EXCEDRIN MIGRAINE) 250-250-65 MG tablet Take 2 tablets by mouth every 6 (six) hours as needed for headache.   Past Week at Unknown time  . amoxicillin (AMOXIL) 500 MG capsule Take 2 capsules (1,000 mg total) by mouth 2 (two) times daily. (Patient not taking: Reported on  11/08/2015) 20 capsule 0   . benzonatate (TESSALON) 100 MG capsule Take 1 capsule (100 mg total) by mouth every 8 (eight) hours. (Patient not taking: Reported on 11/08/2015) 15 capsule 0   . HYDROcodone-homatropine (HYCODAN) 5-1.5 MG/5ML syrup Take 5 mLs by mouth every 6 (six) hours as needed for cough. (Patient not taking: Reported on 11/08/2015) 120 mL 0     Review of Systems  Constitutional: Negative for fever, chills and weight loss.  HENT: Negative for nosebleeds.   Eyes: Negative for blurred vision.  Respiratory: Negative for cough.   Cardiovascular: Negative for chest pain and palpitations.  Gastrointestinal: Negative for heartburn, nausea, vomiting and abdominal pain.  Genitourinary: Negative for dysuria, urgency, frequency, hematuria and flank pain.  Musculoskeletal: Positive for back pain. Negative for myalgias.  Skin: Negative for itching and rash.  Neurological: Negative for dizziness and headaches.  Endo/Heme/Allergies: Does not bruise/bleed easily.   Physical Exam   Blood pressure 133/74, pulse 79, temperature 98.3 F (36.8 C), temperature source Oral, resp. rate 18, last menstrual period 10/03/2015.  Physical Exam  Constitutional: She is oriented to person, place, and time. She appears well-developed and well-nourished.  HENT:  Head: Normocephalic and atraumatic.  Eyes: Pupils are equal, round, and reactive to light.  Neck: Normal range of motion.  Cardiovascular: Normal rate, regular rhythm and normal heart sounds.   Respiratory: Effort normal and breath sounds normal.  GI: Soft. Bowel sounds are normal. There is no tenderness.  Genitourinary: Vagina normal and uterus normal. No vaginal discharge found.  Musculoskeletal: Normal range of motion.  Neurological: She is alert and oriented to person, place, and time.  Skin: Skin is warm and dry. No rash noted. No erythema. No pallor.  Psychiatric: She has a normal mood and affect. Her behavior is normal. Judgment and  thought content normal.    MAU Course  Procedures  MDM UPT negative Urinalysis normal  Assessment and Plan  Late menses or early pregnancy, advised to take another pregnancy test in 1 week if menstrual cycle does not begin  Samuel H SwazilandJordan 11/08/2015, 4:57 PM   Evaluation and management procedures were performed by PA under my supervision/collaboration. Chart reviewed, patient examined by me and I agree with management and plan. See my progress note.

## 2016-01-01 ENCOUNTER — Emergency Department (HOSPITAL_COMMUNITY)
Admission: EM | Admit: 2016-01-01 | Discharge: 2016-01-02 | Disposition: A | Discharge status: Home / Self Care | Payer: Self-pay | Attending: Emergency Medicine | Admitting: Emergency Medicine

## 2016-01-01 ENCOUNTER — Encounter (HOSPITAL_COMMUNITY): Payer: Self-pay | Admitting: Emergency Medicine

## 2016-01-01 DIAGNOSIS — M79601 Pain in right arm: Secondary | ICD-10-CM | POA: Insufficient documentation

## 2016-01-01 DIAGNOSIS — J45909 Unspecified asthma, uncomplicated: Secondary | ICD-10-CM | POA: Insufficient documentation

## 2016-01-01 DIAGNOSIS — R51 Headache: Secondary | ICD-10-CM | POA: Insufficient documentation

## 2016-01-01 LAB — CBC
HCT: 42.5 % (ref 36.0–46.0)
HEMOGLOBIN: 14.1 g/dL (ref 12.0–15.0)
MCH: 28.1 pg (ref 26.0–34.0)
MCHC: 33.2 g/dL (ref 30.0–36.0)
MCV: 84.7 fL (ref 78.0–100.0)
PLATELETS: 170 10*3/uL (ref 150–400)
RBC: 5.02 MIL/uL (ref 3.87–5.11)
RDW: 12.5 % (ref 11.5–15.5)
WBC: 8.7 10*3/uL (ref 4.0–10.5)

## 2016-01-01 LAB — I-STAT CHEM 8, ED
BUN: 8 mg/dL (ref 6–20)
CHLORIDE: 108 mmol/L (ref 101–111)
Calcium, Ion: 1.11 mmol/L — ABNORMAL LOW (ref 1.12–1.23)
Creatinine, Ser: 0.6 mg/dL (ref 0.44–1.00)
Glucose, Bld: 96 mg/dL (ref 65–99)
HEMATOCRIT: 44 % (ref 36.0–46.0)
Hemoglobin: 15 g/dL (ref 12.0–15.0)
POTASSIUM: 3.9 mmol/L (ref 3.5–5.1)
SODIUM: 140 mmol/L (ref 135–145)
TCO2: 20 mmol/L (ref 0–100)

## 2016-01-01 NOTE — ED Notes (Signed)
Patient came to desk stated that she was not waiting, threw labels on desk, and walked away.

## 2016-01-01 NOTE — ED Notes (Signed)
Patient states that she has had a headache for two days, has been having a hard time sleeping with the pain.  She states that her right arm started hurting today, and feels jittery today.  Patient denies any nausea or vomiting at this time.

## 2016-02-11 ENCOUNTER — Encounter (HOSPITAL_COMMUNITY): Payer: Self-pay

## 2016-02-11 ENCOUNTER — Emergency Department (HOSPITAL_COMMUNITY)
Admission: EM | Admit: 2016-02-11 | Discharge: 2016-02-11 | Disposition: A | Discharge status: Home / Self Care | Payer: Self-pay | Attending: Emergency Medicine | Admitting: Emergency Medicine

## 2016-02-11 DIAGNOSIS — M79602 Pain in left arm: Secondary | ICD-10-CM | POA: Insufficient documentation

## 2016-02-11 DIAGNOSIS — J45909 Unspecified asthma, uncomplicated: Secondary | ICD-10-CM | POA: Insufficient documentation

## 2016-02-11 DIAGNOSIS — Z87891 Personal history of nicotine dependence: Secondary | ICD-10-CM | POA: Insufficient documentation

## 2016-02-11 MED ORDER — IBUPROFEN 800 MG PO TABS
800.0000 mg | ORAL_TABLET | Freq: Once | ORAL | Status: AC
  Administered 2016-02-11: 800 mg via ORAL
  Filled 2016-02-11: qty 1

## 2016-02-11 NOTE — ED Notes (Addendum)
Pt c/o of generalized L arm pain. Pt states that she was woken up from sleep from the pain. Hx of a cyst in her L hand. A&Ox4. Pt tearful in triage. Denies SOB and chest pain. Denies injury.

## 2016-02-11 NOTE — Discharge Instructions (Signed)

## 2016-02-11 NOTE — ED Provider Notes (Signed)
CSN: [id number]     Arrival date & time 02/11/16  0128 History  By signing my name below, I, Sylvia Chang, attest that this documentation has been prepared under the direction and in the presence of Lyndal Pulleyaniel Ginger Leeth, MD. Electronically Signed: Phillis HaggisGabriella Chang, ED Scribe. 02/11/2016. 2:08 AM.    Chief Complaint  Patient presents with  . Arm Pain    Left Arm   Patient is a 25 y.o. female presenting with arm pain. The history is provided by the patient. No language interpreter was used.  Arm Pain This is a new problem. The current episode started 6 to 12 hours ago. The problem occurs constantly. The problem has been gradually worsening. Pertinent negatives include no chest pain and no shortness of breath. She has tried nothing for the symptoms. The treatment provided no relief.  HPI Comments: Sylvia Chang is a 25 y.o. Female with a hx of left hand cyst who presents to the Emergency Department complaining of cramping, generalized inner left arm pain onset PTA. Pt reports waking from sleep due to pain. She did not have any pain prior to going to sleep. She is unsure if she was sleeping on her arm. She has not tried anything for her symptoms. She denies strenuous activity, injury, SOB, chest pain, numbness, or weakness.   Past Medical History  Diagnosis Date  . Bronchitis   . Ovarian cyst   . Asthma   . Thrombocytopenia complicating pregnancy [facility](HCC)    Past Surgical History  Procedure Laterality Date  . Sinus surgery with instatrak    . Tonsillectomy and adenoidectomy    . Tubes in ears    . Appendectomy     Family History  Problem Relation Age of Onset  . Heart disease Mother     died at 7337  . Hypertension Mother     died at 5837  . Diabetes Father   . Diabetes Paternal Uncle   . Diabetes Paternal Grandfather    Social History  Substance Use Topics  . Smoking status: Former Smoker    Types: Cigarettes    Quit date: 09/20/2015  . Smokeless tobacco: Never Used  . Alcohol Use: No    OB History    Gravida Para Term Preterm AB TAB SAB Ectopic Multiple Living   1 1 1       1      Review of Systems  Respiratory: Negative for shortness of breath.   Cardiovascular: Negative for chest pain.  Musculoskeletal: Positive for arthralgias.  Neurological: Negative for weakness and numbness.  All other systems reviewed and are negative.  Allergies  Review of patient's allergies indicates no known allergies.  Home Medications   Prior to Admission medications   Not on File   BP 156/86 mmHg  Pulse 114  Temp(Src) 98.1 F (36.7 C) (Oral)  Resp 20  Ht 5\' 3"  (1.6 m)  SpO2 100% Physical Exam  Constitutional: She is oriented to person, place, and time. She appears well-developed and well-nourished. No distress.  HENT:  Head: Normocephalic.  Eyes: Conjunctivae are normal.  Neck: Neck supple. No tracheal deviation present.  Cardiovascular: Normal rate and regular rhythm.   Pulmonary/Chest: Effort normal. No respiratory distress.  Abdominal: Soft. She exhibits no distension.  Musculoskeletal:  Left arm: Radial/ulnar/medial nerves intact distally for strength and motor; 1 cm cyst on the back of the left hand non-tender to palpation; mild bicep tenderness  Neurological: She is alert and oriented to person, place, and time.  Skin: Skin  is warm and dry.  Psychiatric: She has a normal mood and affect.  Nursing note and vitals reviewed.   ED Course  Procedures (including critical care time) DIAGNOSTIC STUDIES: Oxygen Saturation is 100% on RA, normal by my interpretation.    COORDINATION OF CARE: 2:06 AM-Discussed treatment plan which includes anti-inflammatories with pt at bedside and pt agreed to plan.    Labs Review Labs Reviewed - No data to display  Imaging Review No results found. I have personally reviewed and evaluated these images and lab results as part of my medical decision-making.   EKG Interpretation None      MDM   Final diagnoses:  Left arm  pain    25 year old female presents with left medial biceps pain that started when she woke up tonight. She was drinking at a beer fest earlier this evening but did not have any overexertion of the arm. She is also complaining of pain over a cyst in her left dorsal hand which is chronic in nature and does not appear to be causing her any discomfort on palpation today. Neurovascularly intact distal to complaint area. Pt given instructions for supportive care including NSAIDs, rest, ice, compression, and elevation to help alleviate symptoms.   I personally performed the services described in this documentation, which was scribed in my presence. The recorded information has been reviewed and is accurate.     Lyndal Pulley, MD 02/11/16 [phone]

## 2016-07-29 NOTE — L&D Delivery Note (Signed)
Patient is a 26 y.o. now [id number] who admitted for IOL for gestational HTN, now s/p NSVD at 5354w6d, s/p IOL with foley bulb, cytotec, followed by Pitocin. AROM at 1509.  Prenatal course also complicated by gestational thrombocytopenia, and h/o PPH requiring blood transfusion.   Delivery Note At 4:10 PM a viable female was delivered via Vaginal, Spontaneous Delivery (Presentation: vertex;  LOA).  APGAR: , ; weight pending.    Placenta status: intact, sent to L&D.   Cord: 3-vessel  Anesthesia:  Epidural Episiotomy: None Lacerations:  1st degree perineal, hemostatic Suture Repair: none Est. Blood Loss (mL):  650  Head delivered LOA. No nuchal cord present. Shoulder and body delivered in usual fashion. Infant to mother's abdomen. Cord clamped x 2 after 1-minute delay, and cut by family member. IV Pitocin started. Cord blood drawn.  Placenta delivered spontaneously with gentle cord traction. Small gush of blood noted. Fundus firm with massage, but lower uterine segment with several clots which were cleared. Bimanual massage done and uterus firm. Perineum inspected and found to have small 1st degree perineal laceration, which was found to be hemostatic. Cytotec 800 mcg given buccally prophylactic .   Mom to postpartum.  Baby to Couplet care / Skin to Skin.  Raynelle FanningJulie P. Sakia Schrimpf, MD OB Fellow 02/26/17, 4:35 PM

## 2016-08-12 DIAGNOSIS — Z3490 Encounter for supervision of normal pregnancy, unspecified, unspecified trimester: Secondary | ICD-10-CM | POA: Insufficient documentation

## 2016-08-13 ENCOUNTER — Ambulatory Visit (INDEPENDENT_AMBULATORY_CARE_PROVIDER_SITE_OTHER): Payer: Medicaid Other | Admitting: Obstetrics & Gynecology

## 2016-08-13 ENCOUNTER — Other Ambulatory Visit (HOSPITAL_COMMUNITY)
Admission: RE | Admit: 2016-08-13 | Discharge: 2016-08-13 | Disposition: A | Discharge status: Home / Self Care | Payer: Medicaid Other | Source: Ambulatory Visit | Attending: Obstetrics & Gynecology | Admitting: Obstetrics & Gynecology

## 2016-08-13 ENCOUNTER — Encounter: Payer: Self-pay | Admitting: Obstetrics & Gynecology

## 2016-08-13 DIAGNOSIS — Z3490 Encounter for supervision of normal pregnancy, unspecified, unspecified trimester: Secondary | ICD-10-CM | POA: Diagnosis present

## 2016-08-13 DIAGNOSIS — Z3491 Encounter for supervision of normal pregnancy, unspecified, first trimester: Secondary | ICD-10-CM

## 2016-08-13 DIAGNOSIS — O99210 Obesity complicating pregnancy, unspecified trimester: Secondary | ICD-10-CM

## 2016-08-13 DIAGNOSIS — Z23 Encounter for immunization: Secondary | ICD-10-CM | POA: Diagnosis not present

## 2016-08-13 DIAGNOSIS — O99211 Obesity complicating pregnancy, first trimester: Secondary | ICD-10-CM

## 2016-08-13 MED ORDER — PRENATAL VITAMINS 0.8 MG PO TABS: 1.0000 | ORAL_TABLET | Freq: Every day | ORAL | 12 refills | Status: DC

## 2016-08-13 MED ORDER — FAMOTIDINE 40 MG PO TABS: 40.0000 mg | ORAL_TABLET | Freq: Every evening | ORAL | 6 refills | Status: DC

## 2016-08-13 NOTE — Progress Notes (Signed)
  Subjective:    Sylvia Chang is a G2P1001 690w0d being seen today for her first obstetrical visit.  Her obstetrical history is significant for obesity. Patient does intend to breast feed. Pregnancy history fully reviewed.  Patient reports nausea.  Vitals:   08/13/16 0929  BP: (!) 98/56  Pulse: 79  Temp: 98.1 F (36.7 C)  Weight: (!) 331 lb (150.1 kg)    HISTORY: OB History  Gravida Para Term Preterm AB Living  2 1 1     1   SAB TAB Ectopic Multiple Live Births          1    # Outcome Date GA Lbr Len/2nd Weight Sex Delivery Anes PTL Lv  2 Current           1 Term 04/13/12 5739w1d 21:15 / 01:50 7 lb 8.5 oz (3.415 kg) F Vag-Spont EPI  LIV     Past Medical History:  Diagnosis Date  . Asthma   . Bronchitis   . Ovarian cyst   . Thrombocytopenia complicating pregnancy [facility](HCC)    Past Surgical History:  Procedure Laterality Date  . APPENDECTOMY    . SINUS SURGERY WITH INSTATRAK    . TONSILLECTOMY AND ADENOIDECTOMY    . TUBES IN EARS     Family History  Problem Relation Age of Onset  . Heart disease Mother     died at 5437  . Hypertension Mother     died at 4037  . Diabetes Father   . Diabetes Paternal Uncle   . Diabetes Paternal Grandfather      Exam    Uterus:     Pelvic Exam:    Perineum: No Hemorrhoids   Vulva: normal   Vagina:  normal mucosa   pH:     Cervix: no lesions   Adnexa: no mass, fullness, tenderness   Bony Pelvis: average  System: Breast:  normal appearance, no masses or tenderness   Skin: normal coloration and turgor, no rashes    Neurologic: oriented, normal mood   Extremities: normal strength, tone, and muscle mass   HEENT PERRLA   Mouth/Teeth dental hygiene good   Neck supple   Cardiovascular: regular rate and rhythm, no murmurs or gallops   Respiratory:  appears well, vitals normal, no respiratory distress, acyanotic, normal RR, neck free of mass or lymphadenopathy, chest clear, no wheezing, crepitations, rhonchi, normal symmetric air  entry   Abdomen: obese, no mass   Urinary: urethral meatus normal      Assessment:    Pregnancy: G2P1001 Patient Active Problem List   Diagnosis Date Noted  . Maternal morbid obesity, antepartum (HCC) 08/13/2016  . Supervision of normal pregnancy, antepartum 08/12/2016        Plan:     Initial labs drawn. Prenatal vitamins. Problem list reviewed and updated. Genetic Screening discussed First Screen: requested.  Ultrasound discussed; fetal survey: 18+ weeks.  Follow up in 4 weeks. 50% of 30 min visit spent on counseling and coordination of care.  Pepcid for heartburn   [name] 08/13/2016

## 2016-08-15 ENCOUNTER — Encounter (HOSPITAL_COMMUNITY): Payer: Self-pay | Admitting: Obstetrics & Gynecology

## 2016-08-15 LAB — GC/CHLAMYDIA PROBE AMP (~~LOC~~) NOT AT ARMC
Chlamydia: NEGATIVE
Neisseria Gonorrhea: NEGATIVE

## 2016-08-15 LAB — CYTOLOGY - PAP
Chlamydia: NEGATIVE
DIAGNOSIS: NEGATIVE
NEISSERIA GONORRHEA: NEGATIVE

## 2016-08-16 LAB — URINE CULTURE, OB REFLEX

## 2016-08-16 LAB — CULTURE, OB URINE

## 2016-08-20 LAB — OBSTETRIC PANEL, INCLUDING HIV
Antibody Screen: NEGATIVE
BASOS ABS: 0 10*3/uL (ref 0.0–0.2)
Basos: 0 %
EOS (ABSOLUTE): 0.2 10*3/uL (ref 0.0–0.4)
Eos: 2 %
HIV SCREEN 4TH GENERATION: NONREACTIVE
Hematocrit: 38.3 % (ref 34.0–46.6)
Hemoglobin: 12.7 g/dL (ref 11.1–15.9)
Hepatitis B Surface Ag: NEGATIVE
Immature Grans (Abs): 0 10*3/uL (ref 0.0–0.1)
Immature Granulocytes: 0 %
Lymphocytes Absolute: 1.7 10*3/uL (ref 0.7–3.1)
Lymphs: 25 %
MCH: 28.5 pg (ref 26.6–33.0)
MCHC: 33.2 g/dL (ref 31.5–35.7)
MCV: 86 fL (ref 79–97)
Monocytes Absolute: 0.5 10*3/uL (ref 0.1–0.9)
Monocytes: 6 %
NEUTROS ABS: 4.7 10*3/uL (ref 1.4–7.0)
Neutrophils: 67 %
PLATELETS: 137 10*3/uL — AB (ref 150–379)
RBC: 4.46 x10E6/uL (ref 3.77–5.28)
RDW: 13.4 % (ref 12.3–15.4)
RPR Ser Ql: NONREACTIVE
Rh Factor: NEGATIVE
Rubella Antibodies, IGG: 1.13 index (ref >0.99)
WBC: 7 10*3/uL (ref 3.4–10.8)

## 2016-08-20 LAB — TOXASSURE SELECT 13 (MW), URINE

## 2016-08-20 LAB — GLUCOSE TOLERANCE, 1 HOUR: GLUCOSE, 1HR PP: 99 mg/dL (ref 65–199)

## 2016-08-22 ENCOUNTER — Other Ambulatory Visit: Payer: Self-pay | Admitting: Obstetrics & Gynecology

## 2016-08-22 ENCOUNTER — Ambulatory Visit (HOSPITAL_COMMUNITY): Payer: Medicaid Other

## 2016-08-22 ENCOUNTER — Encounter (HOSPITAL_COMMUNITY): Payer: Self-pay

## 2016-08-22 ENCOUNTER — Ambulatory Visit (HOSPITAL_COMMUNITY)
Admission: RE | Admit: 2016-08-22 | Discharge: 2016-08-22 | Disposition: A | Discharge status: Home / Self Care | Payer: Medicaid Other | Source: Ambulatory Visit | Attending: Obstetrics & Gynecology | Admitting: Obstetrics & Gynecology

## 2016-08-22 DIAGNOSIS — Z3A13 13 weeks gestation of pregnancy: Secondary | ICD-10-CM | POA: Diagnosis not present

## 2016-08-22 DIAGNOSIS — Z3490 Encounter for supervision of normal pregnancy, unspecified, unspecified trimester: Secondary | ICD-10-CM

## 2016-08-22 DIAGNOSIS — Z3689 Encounter for other specified antenatal screening: Secondary | ICD-10-CM | POA: Insufficient documentation

## 2016-08-22 DIAGNOSIS — D696 Thrombocytopenia, unspecified: Secondary | ICD-10-CM | POA: Insufficient documentation

## 2016-08-22 DIAGNOSIS — O09291 Supervision of pregnancy with other poor reproductive or obstetric history, first trimester: Secondary | ICD-10-CM

## 2016-08-22 DIAGNOSIS — O99211 Obesity complicating pregnancy, first trimester: Secondary | ICD-10-CM | POA: Insufficient documentation

## 2016-08-22 DIAGNOSIS — Z3682 Encounter for antenatal screening for nuchal translucency: Secondary | ICD-10-CM | POA: Diagnosis not present

## 2016-08-26 ENCOUNTER — Other Ambulatory Visit: Payer: Self-pay

## 2016-08-26 ENCOUNTER — Telehealth: Payer: Self-pay | Admitting: *Deleted

## 2016-08-26 NOTE — Telephone Encounter (Signed)
Pt called in stating she has seen "a lot of blood when using the bathroom". She is unsure if it is vaginal or rectal bleeding. Advised pt to be evaluated at [facility] for bleeding. Pt expressed understanding.

## 2016-08-27 ENCOUNTER — Encounter: Payer: Self-pay | Admitting: *Deleted

## 2016-09-05 ENCOUNTER — Ambulatory Visit (INDEPENDENT_AMBULATORY_CARE_PROVIDER_SITE_OTHER): Payer: Medicaid Other | Admitting: Obstetrics and Gynecology

## 2016-09-05 VITALS — BP 133/88 | HR 89 | Temp 98.6°F | Wt 330.0 lb

## 2016-09-05 DIAGNOSIS — O99212 Obesity complicating pregnancy, second trimester: Secondary | ICD-10-CM

## 2016-09-05 DIAGNOSIS — O99210 Obesity complicating pregnancy, unspecified trimester: Principal | ICD-10-CM

## 2016-09-05 DIAGNOSIS — Z3480 Encounter for supervision of other normal pregnancy, unspecified trimester: Secondary | ICD-10-CM

## 2016-09-05 NOTE — Progress Notes (Signed)
   PRENATAL VISIT NOTE  Subjective:  Sylvia Chang is a 26 y.o. G2P1001 at 3333w0d being seen today for ongoing prenatal care.  She is currently monitored for the following issues for this low-risk pregnancy and has Supervision of normal pregnancy, antepartum and Maternal morbid obesity, antepartum (HCC) on her problem list.  Patient reports frequent nose bleeds when blowing her nose and some headaches.  Contractions: Not present. Vag. Bleeding: None.   . Denies leaking of fluid.   The following portions of the patient's history were reviewed and updated as appropriate: allergies, current medications, past family history, past medical history, past social history, past surgical history and problem list. Problem list updated.  Objective:   Vitals:   09/05/16 0834  BP: 133/88  Pulse: 89  Temp: 98.6 F (37 C)  Weight: (!) 330 lb (149.7 kg)    Fetal Status: Fetal Heart Rate (bpm): + on sono         General:  Alert, oriented and cooperative. Patient is in no acute distress.  Skin: Skin is warm and dry. No rash noted.   Cardiovascular: Normal heart rate noted  Respiratory: Normal respiratory effort, no problems with respiration noted  Abdomen: Soft, gravid, appropriate for gestational age. Pain/Pressure: Present     Pelvic:  Cervical exam deferred        Extremities: Normal range of motion.  Edema: None  Mental Status: Normal mood and affect. Normal behavior. Normal judgment and thought content.   Assessment and Plan:  Pregnancy: G2P1001 at 8033w0d  1. Maternal morbid obesity, antepartum (HCC)   2. Supervision of other normal pregnancy, antepartum Informed patient of the safe use of tylenol for headaches during pregnancy. Advised patient to stay well hydrated at least 8-10 glasses per day Informed patient to use a humidifier in the evening to help with nose bleeds Anatomy ultrasound ordered - US MFM OB COMP + 14 WK; Future  General obstetric precautions including but not  limited to vaginal bleeding, contractions, leaking of fluid and fetal movement were reviewed in detail with the patient. Please refer to After Visit Summary for other counseling recommendations.  Return in about 4 weeks (around 10/03/2016) for ROB.   Catalina AntiguaPeggy Priyanka Causey, MD

## 2016-09-05 NOTE — Progress Notes (Signed)
   PRENATAL VISIT NOTE  Subjective:  Sylvia Chang is a 26 y.o. G2P1001 at 7548w0d being seen today for ongoing prenatal care.  She is currently monitored for the following issues for this low-risk pregnancy and has Supervision of normal pregnancy, antepartum and Maternal morbid obesity, antepartum (HCC) on her problem list.  Patient reports headaches and nose bleeds when blowing her nose.  Contractions: Not present. Vag. Bleeding: None.   . Denies leaking of fluid.   The following portions of the patient's history were reviewed and updated as appropriate: allergies, current medications, past family history, past medical history, past social history, past surgical history and problem list. Problem list updated.  Objective:   Vitals:   09/05/16 0834  BP: 133/88  Pulse: 89  Temp: 98.6 F (37 C)  Weight: (!) 330 lb (149.7 kg)    Fetal Status:           General:  Alert, oriented and cooperative. Patient is in no acute distress.  Skin: Skin is warm and dry. No rash noted.   Cardiovascular: Normal heart rate noted  Respiratory: Normal respiratory effort, no problems with respiration noted  Abdomen: Soft, gravid, appropriate for gestational age. Pain/Pressure: Present     Pelvic:  Cervical exam deferred        Extremities: Normal range of motion.  Edema: None  Mental Status: Normal mood and affect. Normal behavior. Normal judgment and thought content.   Assessment and Plan:  Pregnancy: G2P1001 at 3048w0d  1. Maternal morbid obesity, antepartum (HCC)   2. Supervision of other normal pregnancy, antepartum Discussed the use of a humidifier in the evening Discussed the safe use of tylenol during pregnancy Anatomy ultrasound ordered - US MFM OB COMP + 14 WK; Future  General obstetric precautions including but not limited to vaginal bleeding, contractions, leaking of fluid and fetal movement were reviewed in detail with the patient. Please refer to After Visit Summary for other  counseling recommendations.  Return in about 4 weeks (around 10/03/2016) for ROB.   Catalina AntiguaPeggy Brittane Grudzinski, MD

## 2016-09-05 NOTE — Progress Notes (Signed)
Patient c/o nose bleeds and frequent headaches

## 2016-09-10 ENCOUNTER — Encounter: Payer: Medicaid Other | Admitting: Obstetrics & Gynecology

## 2016-10-03 ENCOUNTER — Encounter: Payer: Self-pay | Admitting: Obstetrics

## 2016-10-03 ENCOUNTER — Ambulatory Visit (HOSPITAL_COMMUNITY)
Admission: RE | Admit: 2016-10-03 | Discharge: 2016-10-03 | Disposition: A | Discharge status: Home / Self Care | Payer: Medicaid Other | Source: Ambulatory Visit | Attending: Obstetrics and Gynecology | Admitting: Obstetrics and Gynecology

## 2016-10-03 ENCOUNTER — Encounter: Payer: Self-pay | Admitting: *Deleted

## 2016-10-03 ENCOUNTER — Other Ambulatory Visit: Payer: Self-pay | Admitting: Obstetrics and Gynecology

## 2016-10-03 ENCOUNTER — Ambulatory Visit (INDEPENDENT_AMBULATORY_CARE_PROVIDER_SITE_OTHER): Payer: Medicaid Other | Admitting: Obstetrics & Gynecology

## 2016-10-03 VITALS — BP 111/74 | HR 74 | Wt 337.0 lb

## 2016-10-03 DIAGNOSIS — Z363 Encounter for antenatal screening for malformations: Secondary | ICD-10-CM | POA: Diagnosis present

## 2016-10-03 DIAGNOSIS — O99210 Obesity complicating pregnancy, unspecified trimester: Secondary | ICD-10-CM

## 2016-10-03 DIAGNOSIS — Z0489 Encounter for examination and observation for other specified reasons: Secondary | ICD-10-CM

## 2016-10-03 DIAGNOSIS — Z3A18 18 weeks gestation of pregnancy: Secondary | ICD-10-CM

## 2016-10-03 DIAGNOSIS — Z3480 Encounter for supervision of other normal pregnancy, unspecified trimester: Secondary | ICD-10-CM

## 2016-10-03 DIAGNOSIS — O99212 Obesity complicating pregnancy, second trimester: Secondary | ICD-10-CM

## 2016-10-03 DIAGNOSIS — IMO0002 Reserved for concepts with insufficient information to code with codable children: Secondary | ICD-10-CM

## 2016-10-03 DIAGNOSIS — O09292 Supervision of pregnancy with other poor reproductive or obstetric history, second trimester: Secondary | ICD-10-CM | POA: Insufficient documentation

## 2016-10-03 DIAGNOSIS — Z3482 Encounter for supervision of other normal pregnancy, second trimester: Secondary | ICD-10-CM

## 2016-10-03 NOTE — Patient Instructions (Signed)
Return to clinic for any scheduled appointments or obstetric concerns, or go to [facility] for evaluation  

## 2016-10-03 NOTE — Progress Notes (Signed)
   PRENATAL VISIT NOTE  Subjective:  Sylvia KeensVeronica D Chang is a 26 y.o. G2P1001 at 3757w0d being seen today for ongoing prenatal care.  She is currently monitored for the following issues for this low-risk pregnancy and has Supervision of normal pregnancy, antepartum and Maternal morbid obesity, antepartum (HCC) on her problem list.  Patient reports no complaints.  Contractions: Not present. Vag. Bleeding: None.  Movement: Present. Denies leaking of fluid.   The following portions of the patient's history were reviewed and updated as appropriate: allergies, current medications, past family history, past medical history, past social history, past surgical history and problem list. Problem list updated.  Objective:   Vitals:   10/03/16 1041  BP: 111/74  Pulse: 74  Weight: (!) 337 lb (152.9 kg)    Fetal Status: Fetal Heart Rate (bpm): + on u/s   Movement: Present     General:  Alert, oriented and cooperative. Patient is in no acute distress.  Skin: Skin is warm and dry. No rash noted.   Cardiovascular: Normal heart rate noted  Respiratory: Normal respiratory effort, no problems with respiration noted  Abdomen: Soft, gravid, appropriate for gestational age. Pain/Pressure: Absent     Pelvic:  Cervical exam deferred        Extremities: Normal range of motion.  Edema: Trace  Mental Status: Normal mood and affect. Normal behavior. Normal judgment and thought content.   Assessment and Plan:  Pregnancy: G2P1001 at 5757w0d  1. Maternal morbid obesity, antepartum (HCC) Total weight gain is 7 lbs so far, doing well.   2. Evaluate anatomy not seen on prior sonogram Inadequate anatomy scan today, follow up scan - US MFM OB FOLLOW UP; Future  3. Supervision of other normal pregnancy, antepartum Normal first trimester screen, AFP only today.  - AFP, Serum, Open Spina Bifida No other complaints or concerns.  Routine obstetric precautions reviewed. Please refer to After Visit Summary for other  counseling recommendations.  Return in about 4 weeks (around 10/31/2016) for OB Visit.   Tereso NewcomerUgonna A Elisah Parmer, MD

## 2016-10-05 LAB — AFP, SERUM, OPEN SPINA BIFIDA
AFP MOM: 0.73
AFP VALUE AFPOSL: 20 ng/mL
Gest. Age on Collection Date: 18 weeks
Maternal Age At EDD: 24.3 years
OSBR RISK 1 IN: 10000
Test Results:: NEGATIVE
WEIGHT: 337 [lb_av]

## 2016-10-31 ENCOUNTER — Encounter: Payer: Self-pay | Admitting: *Deleted

## 2016-10-31 ENCOUNTER — Ambulatory Visit (INDEPENDENT_AMBULATORY_CARE_PROVIDER_SITE_OTHER): Payer: Medicaid Other | Admitting: Obstetrics & Gynecology

## 2016-10-31 ENCOUNTER — Other Ambulatory Visit: Payer: Self-pay | Admitting: Obstetrics & Gynecology

## 2016-10-31 ENCOUNTER — Ambulatory Visit (HOSPITAL_COMMUNITY)
Admission: RE | Admit: 2016-10-31 | Discharge: 2016-10-31 | Disposition: A | Discharge status: Home / Self Care | Payer: Medicaid Other | Source: Ambulatory Visit | Attending: Obstetrics & Gynecology | Admitting: Obstetrics & Gynecology

## 2016-10-31 DIAGNOSIS — Z3A22 22 weeks gestation of pregnancy: Secondary | ICD-10-CM | POA: Diagnosis not present

## 2016-10-31 DIAGNOSIS — IMO0002 Reserved for concepts with insufficient information to code with codable children: Secondary | ICD-10-CM

## 2016-10-31 DIAGNOSIS — O99212 Obesity complicating pregnancy, second trimester: Secondary | ICD-10-CM

## 2016-10-31 DIAGNOSIS — Z0489 Encounter for examination and observation for other specified reasons: Secondary | ICD-10-CM

## 2016-10-31 DIAGNOSIS — Z362 Encounter for other antenatal screening follow-up: Secondary | ICD-10-CM | POA: Insufficient documentation

## 2016-10-31 DIAGNOSIS — Z3480 Encounter for supervision of other normal pregnancy, unspecified trimester: Secondary | ICD-10-CM

## 2016-10-31 NOTE — Progress Notes (Signed)
   PRENATAL VISIT NOTE  Subjective:  Sylvia Chang is a 26 y.o. G2P1001 at [redacted]w[redacted]d being seen today for ongoing prenatal care.  She is currently monitored for the following issues for this low-risk pregnancy and has Supervision of normal pregnancy, antepartum and Maternal morbid obesity, antepartum (HCC) on her problem list.  Patient reports no complaints.  Contractions: Not present. Vag. Bleeding: None.  Movement: Present. Denies leaking of fluid.   The following portions of the patient's history were reviewed and updated as appropriate: allergies, current medications, past family history, past medical history, past social history, past surgical history and problem list. Problem list updated.  Objective:   Vitals:   10/31/16 1003  BP: (!) 146/81  Pulse: 78  Weight: (!) 341 lb 4.8 oz (154.8 kg)    Fetal Status: Fetal Heart Rate (bpm): 155 Fundal Height: 22 cm Movement: Present     General:  Alert, oriented and cooperative. Patient is in no acute distress.  Skin: Skin is warm and dry. No rash noted.   Cardiovascular: Normal heart rate noted  Respiratory: Normal respiratory effort, no problems with respiration noted  Abdomen: Soft, gravid, appropriate for gestational age. Pain/Pressure: Absent     Pelvic:  Cervical exam deferred        Extremities: Normal range of motion.  Edema: Trace  Mental Status: Normal mood and affect. Normal behavior. Normal judgment and thought content.   Assessment and Plan:  Pregnancy: G2P1001 at [redacted]w[redacted]d  1. Supervision of other normal pregnancy, antepartum BP normal on repeat  Preterm labor symptoms and general obstetric precautions including but not limited to vaginal bleeding, contractions, leaking of fluid and fetal movement were reviewed in detail with the patient. Please refer to After Visit Summary for other counseling recommendations.  Return in about 4 weeks (around 11/28/2016).   Adam Phenix, MD

## 2016-10-31 NOTE — Progress Notes (Signed)
Pt presents for ROB states no complaints. F/U anatomy completed this morning; report not available at this time.

## 2016-10-31 NOTE — Patient Instructions (Signed)

## 2016-11-04 ENCOUNTER — Encounter: Payer: Medicaid Other | Admitting: Obstetrics and Gynecology

## 2016-11-18 ENCOUNTER — Encounter: Payer: Self-pay | Admitting: Obstetrics

## 2016-11-18 ENCOUNTER — Ambulatory Visit (INDEPENDENT_AMBULATORY_CARE_PROVIDER_SITE_OTHER): Payer: Medicaid Other | Admitting: Obstetrics

## 2016-11-18 ENCOUNTER — Encounter: Payer: Self-pay | Admitting: *Deleted

## 2016-11-18 VITALS — BP 134/78 | HR 83 | Wt 342.0 lb

## 2016-11-18 DIAGNOSIS — O99210 Obesity complicating pregnancy, unspecified trimester: Secondary | ICD-10-CM

## 2016-11-18 DIAGNOSIS — O99212 Obesity complicating pregnancy, second trimester: Secondary | ICD-10-CM

## 2016-11-18 DIAGNOSIS — Z3490 Encounter for supervision of normal pregnancy, unspecified, unspecified trimester: Secondary | ICD-10-CM

## 2016-11-18 DIAGNOSIS — Z3482 Encounter for supervision of other normal pregnancy, second trimester: Secondary | ICD-10-CM

## 2016-11-18 DIAGNOSIS — J301 Allergic rhinitis due to pollen: Secondary | ICD-10-CM

## 2016-11-18 MED ORDER — LORATADINE 10 MG PO TABS: 10.0000 mg | ORAL_TABLET | Freq: Every day | ORAL | 11 refills | Status: DC

## 2016-11-18 NOTE — Progress Notes (Signed)
Subjective:  Sylvia Chang is a 26 y.o. G2P1001 at [redacted]w[redacted]d being seen today for ongoing prenatal care.  She is currently monitored for the following issues for this low-risk pregnancy and has Supervision of normal pregnancy, antepartum; Maternal morbid obesity, antepartum (HCC); and Seasonal allergic rhinitis due to pollen on her problem list.  Patient reports seasonal allergies.  Contractions: Not present.  .  Movement: Present. Denies leaking of fluid.   The following portions of the patient's history were reviewed and updated as appropriate: allergies, current medications, past family history, past medical history, past social history, past surgical history and problem list. Problem list updated.  Objective:   Vitals:   11/18/16 0933  BP: 134/78  Pulse: 83  Weight: (!) 342 lb (155.1 kg)    Fetal Status: Fetal Heart Rate (bpm): 150   Movement: Present     General:  Alert, oriented and cooperative. Patient is in no acute distress.  Skin: Skin is warm and dry. No rash noted.   Cardiovascular: Normal heart rate noted  Respiratory: Normal respiratory effort, no problems with respiration noted  Abdomen: Soft, gravid, appropriate for gestational age. Pain/Pressure: Absent     Pelvic:  Cervical exam deferred        Extremities: Normal range of motion.  Edema: Trace  Mental Status: Normal mood and affect. Normal behavior. Normal judgment and thought content.   Urinalysis:      Assessment and Plan:  Pregnancy: G2P1001 at 108w4d  1. Seasonal allergic rhinitis due to pollen Rx: - loratadine (CLARITIN) 10 MG tablet; Take 1 tablet (10 mg total) by mouth daily.  Dispense: 30 tablet; Refill: 11  2. Maternal morbid obesity, antepartum (HCC)   3. Encounter for supervision of normal pregnancy, antepartum, unspecified gravidity   Preterm labor symptoms and general obstetric precautions including but not limited to vaginal bleeding, contractions, leaking of fluid and fetal movement were  reviewed in detail with the patient. Please refer to After Visit Summary for other counseling recommendations.  Return in about 4 weeks (around 12/16/2016) for 2 hour OGTT.   Brock Bad, MD

## 2016-11-18 NOTE — Patient Instructions (Addendum)

## 2016-11-27 ENCOUNTER — Encounter: Payer: Medicaid Other | Admitting: Obstetrics and Gynecology

## 2016-12-16 ENCOUNTER — Encounter: Payer: Self-pay | Admitting: Obstetrics

## 2016-12-16 ENCOUNTER — Ambulatory Visit (INDEPENDENT_AMBULATORY_CARE_PROVIDER_SITE_OTHER): Payer: Medicaid Other | Admitting: Obstetrics and Gynecology

## 2016-12-16 VITALS — BP 127/77 | HR 73 | Wt 346.9 lb

## 2016-12-16 DIAGNOSIS — Z3482 Encounter for supervision of other normal pregnancy, second trimester: Secondary | ICD-10-CM | POA: Diagnosis not present

## 2016-12-16 DIAGNOSIS — O99212 Obesity complicating pregnancy, second trimester: Secondary | ICD-10-CM

## 2016-12-16 DIAGNOSIS — O99210 Obesity complicating pregnancy, unspecified trimester: Principal | ICD-10-CM

## 2016-12-16 DIAGNOSIS — Z6791 Unspecified blood type, Rh negative: Secondary | ICD-10-CM | POA: Insufficient documentation

## 2016-12-16 DIAGNOSIS — Z3480 Encounter for supervision of other normal pregnancy, unspecified trimester: Secondary | ICD-10-CM

## 2016-12-16 DIAGNOSIS — O09892 Supervision of other high risk pregnancies, second trimester: Secondary | ICD-10-CM

## 2016-12-16 DIAGNOSIS — O26899 Other specified pregnancy related conditions, unspecified trimester: Secondary | ICD-10-CM | POA: Insufficient documentation

## 2016-12-16 NOTE — Progress Notes (Signed)
Subjective:  Sylvia KeensVeronica D Chang is a 26 y.o. G2P1001 at 3685w4d being seen today for ongoing prenatal care.  She is currently monitored for the following issues for this low-risk pregnancy and has Supervision of normal pregnancy, antepartum; Maternal morbid obesity, antepartum (HCC); Seasonal allergic rhinitis due to pollen; and Rh negative state in antepartum period on her problem list.  Patient reports no complaints.  Contractions: Not present. Vag. Bleeding: None.  Movement: Present. Denies leaking of fluid.   The following portions of the patient's history were reviewed and updated as appropriate: allergies, current medications, past family history, past medical history, past social history, past surgical history and problem list. Problem list updated.  Objective:   Vitals:   12/16/16 0958  BP: 127/77  Pulse: 73  Weight: (!) 346 lb 14.4 oz (157.4 kg)    Fetal Status: Fetal Heart Rate (bpm): 132   Movement: Present     General:  Alert, oriented and cooperative. Patient is in no acute distress.  Skin: Skin is warm and dry. No rash noted.   Cardiovascular: Normal heart rate noted  Respiratory: Normal respiratory effort, no problems with respiration noted  Abdomen: Soft, gravid, appropriate for gestational age. Pain/Pressure: Present     Pelvic:  Cervical exam deferred        Extremities: Normal range of motion.  Edema: Trace  Mental Status: Normal mood and affect. Normal behavior. Normal judgment and thought content.   Urinalysis:      Assessment and Plan:  Pregnancy: G2P1001 at 6185w4d  1. Supervision of other normal pregnancy, antepartum Stable - US MFM OB FOLLOW UP; Future - HIV antibody - RPR - Antibody screen - Glucose Tolerance, 2 Hours w/1 Hour - CBC  2. Maternal morbid obesity, antepartum (HCC)   3. Rh negative state in antepartum period Antibody screen today. Rhogam at next OB visit  Preterm labor symptoms and general obstetric precautions including but not  limited to vaginal bleeding, contractions, leaking of fluid and fetal movement were reviewed in detail with the patient. Please refer to After Visit Summary for other counseling recommendations.  Return in about 2 weeks (around 12/30/2016) for OB visit.   Hermina StaggersErvin, Kashtyn Jankowski L, MD

## 2016-12-16 NOTE — Patient Instructions (Signed)
Third Trimester of Pregnancy The third trimester is from week 28 through week 40 (months 7 through 9). The third trimester is a time when the unborn baby (fetus) is growing rapidly. At the end of the ninth month, the fetus is about 20 inches in length and weighs 6-10 pounds. Body changes during your third trimester Your body will continue to go through many changes during pregnancy. The changes vary from woman to woman. During the third trimester:  Your weight will continue to increase. You can expect to gain 25-35 pounds (11-16 kg) by the end of the pregnancy.  You may begin to get stretch marks on your hips, abdomen, and breasts.  You may urinate more often because the fetus is moving lower into your pelvis and pressing on your bladder.  You may develop or continue to have heartburn. This is caused by increased hormones that slow down muscles in the digestive tract.  You may develop or continue to have constipation because increased hormones slow digestion and cause the muscles that push waste through your intestines to relax.  You may develop hemorrhoids. These are swollen veins (varicose veins) in the rectum that can itch or be painful.  You may develop swollen, bulging veins (varicose veins) in your legs.  You may have increased body aches in the pelvis, back, or thighs. This is due to weight gain and increased hormones that are relaxing your joints.  You may have changes in your hair. These can include thickening of your hair, rapid growth, and changes in texture. Some women also have hair loss during or after pregnancy, or hair that feels dry or thin. Your hair will most likely return to normal after your baby is born.  Your breasts will continue to grow and they will continue to become tender. A yellow fluid (colostrum) may leak from your breasts. This is the first milk you are producing for your baby.  Your belly button may stick out.  You may notice more swelling in your hands,  face, or ankles.  You may have increased tingling or numbness in your hands, arms, and legs. The skin on your belly may also feel numb.  You may feel short of breath because of your expanding uterus.  You may have more problems sleeping. This can be caused by the size of your belly, increased need to urinate, and an increase in your body's metabolism.  You may notice the fetus "dropping," or moving lower in your abdomen (lightening).  You may have increased vaginal discharge.  You may notice your joints feel loose and you may have pain around your pelvic bone.  What to expect at prenatal visits You will have prenatal exams every 2 weeks until week 36. Then you will have weekly prenatal exams. During a routine prenatal visit:  You will be weighed to make sure you and the baby are growing normally.  Your blood pressure will be taken.  Your abdomen will be measured to track your baby's growth.  The fetal heartbeat will be listened to.  Any test results from the previous visit will be discussed.  You may have a cervical check near your due date to see if your cervix has softened or thinned (effaced).  You will be tested for Group B streptococcus. This happens between 35 and 37 weeks.  Your health care provider may ask you:  What your birth plan is.  How you are feeling.  If you are feeling the baby move.  If you have had   any abnormal symptoms, such as leaking fluid, bleeding, severe headaches, or abdominal cramping.  If you are using any tobacco products, including cigarettes, chewing tobacco, and electronic cigarettes.  If you have any questions.  Other tests or screenings that may be performed during your third trimester include:  Blood tests that check for low iron levels (anemia).  Fetal testing to check the health, activity level, and growth of the fetus. Testing is done if you have certain medical conditions or if there are problems during the  pregnancy.  Nonstress test (NST). This test checks the health of your baby to make sure there are no signs of problems, such as the baby not getting enough oxygen. During this test, a belt is placed around your belly. The baby is made to move, and its heart rate is monitored during movement.  What is false labor? False labor is a condition in which you feel small, irregular tightenings of the muscles in the womb (contractions) that usually go away with rest, changing position, or drinking water. These are called Braxton Hicks contractions. Contractions may last for hours, days, or even weeks before true labor sets in. If contractions come at regular intervals, become more frequent, increase in intensity, or become painful, you should see your health care provider. What are the signs of labor?  Abdominal cramps.  Regular contractions that start at 10 minutes apart and become stronger and more frequent with time.  Contractions that start on the top of the uterus and spread down to the lower abdomen and back.  Increased pelvic pressure and dull back pain.  A watery or bloody mucus discharge that comes from the vagina.  Leaking of amniotic fluid. This is also known as your "water breaking." It could be a slow trickle or a gush. Let your health care provider know if it has a color or strange odor. If you have any of these signs, call your health care provider right away, even if it is before your due date. Follow these instructions at home: Medicines  Follow your health care provider's instructions regarding medicine use. Specific medicines may be either safe or unsafe to take during pregnancy.  Take a prenatal vitamin that contains at least 600 micrograms (mcg) of folic acid.  If you develop constipation, try taking a stool softener if your health care provider approves. Eating and drinking  Eat a balanced diet that includes fresh fruits and vegetables, whole grains, good sources of protein  such as meat, eggs, or tofu, and low-fat dairy. Your health care provider will help you determine the amount of weight gain that is right for you.  Avoid raw meat and uncooked cheese. These carry germs that can cause birth defects in the baby.  If you have low calcium intake from food, talk to your health care provider about whether you should take a daily calcium supplement.  Eat four or five small meals rather than three large meals a day.  Limit foods that are high in fat and processed sugars, such as fried and sweet foods.  To prevent constipation: ? Drink enough fluid to keep your urine clear or pale yellow. ? Eat foods that are high in fiber, such as fresh fruits and vegetables, whole grains, and beans. Activity  Exercise only as directed by your health care provider. Most women can continue their usual exercise routine during pregnancy. Try to exercise for 30 minutes at least 5 days a week. Stop exercising if you experience uterine contractions.  Avoid heavy   lifting.  Do not exercise in extreme heat or humidity, or at high altitudes.  Wear low-heel, comfortable shoes.  Practice good posture.  You may continue to have sex unless your health care provider tells you otherwise. Relieving pain and discomfort  Take frequent breaks and rest with your legs elevated if you have leg cramps or low back pain.  Take warm sitz baths to soothe any pain or discomfort caused by hemorrhoids. Use hemorrhoid cream if your health care provider approves.  Wear a good support bra to prevent discomfort from breast tenderness.  If you develop varicose veins: ? Wear support pantyhose or compression stockings as told by your healthcare provider. ? Elevate your feet for 15 minutes, 3-4 times a day. Prenatal care  Write down your questions. Take them to your prenatal visits.  Keep all your prenatal visits as told by your health care provider. This is important. Safety  Wear your seat belt at  all times when driving.  Make a list of emergency phone numbers, including numbers for family, friends, the hospital, and police and fire departments. General instructions  Avoid cat litter boxes and soil used by cats. These carry germs that can cause birth defects in the baby. If you have a cat, ask someone to clean the litter box for you.  Do not travel far distances unless it is absolutely necessary and only with the approval of your health care provider.  Do not use hot tubs, steam rooms, or saunas.  Do not drink alcohol.  Do not use any products that contain nicotine or tobacco, such as cigarettes and e-cigarettes. If you need help quitting, ask your health care provider.  Do not use any medicinal herbs or unprescribed drugs. These chemicals affect the formation and growth of the baby.  Do not douche or use tampons or scented sanitary pads.  Do not cross your legs for long periods of time.  To prepare for the arrival of your baby: ? Take prenatal classes to understand, practice, and ask questions about labor and delivery. ? Make a trial run to the hospital. ? Visit the hospital and tour the maternity area. ? Arrange for maternity or paternity leave through employers. ? Arrange for family and friends to take care of pets while you are in the hospital. ? Purchase a rear-facing car seat and make sure you know how to install it in your car. ? Pack your hospital bag. ? Prepare the baby's nursery. Make sure to remove all pillows and stuffed animals from the baby's crib to prevent suffocation.  Visit your dentist if you have not gone during your pregnancy. Use a soft toothbrush to brush your teeth and be gentle when you floss. Contact a health care provider if:  You are unsure if you are in labor or if your water has broken.  You become dizzy.  You have mild pelvic cramps, pelvic pressure, or nagging pain in your abdominal area.  You have lower back pain.  You have persistent  nausea, vomiting, or diarrhea.  You have an unusual or bad smelling vaginal discharge.  You have pain when you urinate. Get help right away if:  Your water breaks before 37 weeks.  You have regular contractions less than 5 minutes apart before 37 weeks.  You have a fever.  You are leaking fluid from your vagina.  You have spotting or bleeding from your vagina.  You have severe abdominal pain or cramping.  You have rapid weight loss or weight gain.    You have shortness of breath with chest pain.  You notice sudden or extreme swelling of your face, hands, ankles, feet, or legs.  Your baby makes fewer than 10 movements in 2 hours.  You have severe headaches that do not go away when you take medicine.  You have vision changes. Summary  The third trimester is from week 28 through week 40, months 7 through 9. The third trimester is a time when the unborn baby (fetus) is growing rapidly.  During the third trimester, your discomfort may increase as you and your baby continue to gain weight. You may have abdominal, leg, and back pain, sleeping problems, and an increased need to urinate.  During the third trimester your breasts will keep growing and they will continue to become tender. A yellow fluid (colostrum) may leak from your breasts. This is the first milk you are producing for your baby.  False labor is a condition in which you feel small, irregular tightenings of the muscles in the womb (contractions) that eventually go away. These are called Braxton Hicks contractions. Contractions may last for hours, days, or even weeks before true labor sets in.  Signs of labor can include: abdominal cramps; regular contractions that start at 10 minutes apart and become stronger and more frequent with time; watery or bloody mucus discharge that comes from the vagina; increased pelvic pressure and dull back pain; and leaking of amniotic fluid. This information is not intended to replace advice  given to you by your health care provider. Make sure you discuss any questions you have with your health care provider. Document Released: 07/09/2001 Document Revised: 12/21/2015 Document Reviewed: 09/15/2012 Elsevier Interactive Patient Education  2017 [employer]  

## 2016-12-16 NOTE — Progress Notes (Signed)
Patient reports good fetal movement and occasional pressure, denies contractions and bleeding. 

## 2016-12-17 LAB — CBC
HEMATOCRIT: 34.9 % (ref 34.0–46.6)
HEMOGLOBIN: 11.5 g/dL (ref 11.1–15.9)
MCH: 28.2 pg (ref 26.6–33.0)
MCHC: 33 g/dL (ref 31.5–35.7)
MCV: 86 fL (ref 79–97)
PLATELETS: 124 10*3/uL — AB (ref 150–379)
RBC: 4.08 x10E6/uL (ref 3.77–5.28)
RDW: 13.9 % (ref 12.3–15.4)
WBC: 6.6 10*3/uL (ref 3.4–10.8)

## 2016-12-17 LAB — HIV ANTIBODY (ROUTINE TESTING W REFLEX): HIV SCREEN 4TH GENERATION: NONREACTIVE

## 2016-12-17 LAB — GLUCOSE TOLERANCE, 2 HOURS W/ 1HR
GLUCOSE, FASTING: 79 mg/dL (ref 65–91)
Glucose, 1 hour: 124 mg/dL (ref 65–179)
Glucose, 2 hour: 87 mg/dL (ref 65–152)

## 2016-12-17 LAB — RPR: RPR Ser Ql: NONREACTIVE

## 2016-12-17 LAB — ANTIBODY SCREEN: Antibody Screen: NEGATIVE

## 2016-12-30 ENCOUNTER — Ambulatory Visit (HOSPITAL_COMMUNITY)
Admission: RE | Admit: 2016-12-30 | Discharge: 2016-12-30 | Disposition: A | Discharge status: Home / Self Care | Payer: Medicaid Other | Source: Ambulatory Visit | Attending: Obstetrics and Gynecology | Admitting: Obstetrics and Gynecology

## 2016-12-30 DIAGNOSIS — Z3480 Encounter for supervision of other normal pregnancy, unspecified trimester: Secondary | ICD-10-CM | POA: Diagnosis not present

## 2016-12-30 DIAGNOSIS — Z3A30 30 weeks gestation of pregnancy: Secondary | ICD-10-CM | POA: Diagnosis not present

## 2016-12-31 ENCOUNTER — Other Ambulatory Visit: Payer: Self-pay | Admitting: *Deleted

## 2016-12-31 DIAGNOSIS — Z3689 Encounter for other specified antenatal screening: Secondary | ICD-10-CM

## 2016-12-31 NOTE — Progress Notes (Signed)
Order for u/s placed per Dr Alysia PennaErvin request.

## 2017-01-01 ENCOUNTER — Encounter: Payer: Self-pay | Admitting: *Deleted

## 2017-01-01 ENCOUNTER — Ambulatory Visit (INDEPENDENT_AMBULATORY_CARE_PROVIDER_SITE_OTHER): Payer: Medicaid Other | Admitting: Obstetrics and Gynecology

## 2017-01-01 VITALS — BP 135/69 | HR 98 | Wt 347.0 lb

## 2017-01-01 DIAGNOSIS — O99113 Other diseases of the blood and blood-forming organs and certain disorders involving the immune mechanism complicating pregnancy, third trimester: Secondary | ICD-10-CM

## 2017-01-01 DIAGNOSIS — Z3480 Encounter for supervision of other normal pregnancy, unspecified trimester: Secondary | ICD-10-CM

## 2017-01-01 DIAGNOSIS — O99213 Obesity complicating pregnancy, third trimester: Secondary | ICD-10-CM

## 2017-01-01 DIAGNOSIS — Z23 Encounter for immunization: Secondary | ICD-10-CM | POA: Diagnosis not present

## 2017-01-01 DIAGNOSIS — O99210 Obesity complicating pregnancy, unspecified trimester: Secondary | ICD-10-CM

## 2017-01-01 DIAGNOSIS — D696 Thrombocytopenia, unspecified: Secondary | ICD-10-CM

## 2017-01-01 DIAGNOSIS — O360131 Maternal care for anti-D [Rh] antibodies, third trimester, fetus 1: Secondary | ICD-10-CM | POA: Diagnosis not present

## 2017-01-01 DIAGNOSIS — O26899 Other specified pregnancy related conditions, unspecified trimester: Secondary | ICD-10-CM

## 2017-01-01 DIAGNOSIS — Z6791 Unspecified blood type, Rh negative: Secondary | ICD-10-CM

## 2017-01-01 DIAGNOSIS — O09893 Supervision of other high risk pregnancies, third trimester: Secondary | ICD-10-CM

## 2017-01-01 MED ORDER — RHO D IMMUNE GLOBULIN 1500 UNIT/2ML IJ SOSY
300.0000 ug | PREFILLED_SYRINGE | Freq: Once | INTRAMUSCULAR | Status: AC
  Administered 2017-01-01: 300 ug via INTRAMUSCULAR

## 2017-01-01 NOTE — Progress Notes (Addendum)
Pt states that she has decreased platelets and would like to discuss. Pt states she has not yet been given Rhogam, can be given today. Pt had u/s on 12/30/16, please review. Has follow up scan scheduled for 01/30/17  Pt received Rhogam and Tdap injections at today's visit. Pt tolerated both injections well.  Administrations This Visit    rho (d) immune globulin (RHIG/RHOPHYLAC) injection 300 mcg    Admin Date 01/01/2017 Action Given Dose 300 mcg Route Intramuscular Administered By Lanney GinsFoster, Bernie Ransford D, CMA

## 2017-01-01 NOTE — Progress Notes (Signed)
Subjective:  Sylvia KeensVeronica D Chang is a 26 y.o. G2P1001 at 5849w6d being seen today for ongoing prenatal care.  She is currently monitored for the following issues for this high-risk pregnancy and has Gestational thrombocytopenia (HCC); Supervision of normal pregnancy, antepartum; Maternal morbid obesity, antepartum (HCC); Seasonal allergic rhinitis due to pollen; and Rh negative state in antepartum period on her problem list.  Patient reports no complaints.  Contractions: Irritability. Vag. Bleeding: None.  Movement: Present. Denies leaking of fluid.   The following portions of the patient's history were reviewed and updated as appropriate: allergies, current medications, past family history, past medical history, past social history, past surgical history and problem list. Problem list updated.  Objective:   Vitals:   01/01/17 1442  BP: 135/69  Pulse: 98  Weight: (!) 347 lb (157.4 kg)    Fetal Status:     Movement: Present     General:  Alert, oriented and cooperative. Patient is in no acute distress.  Skin: Skin is warm and dry. No rash noted.   Cardiovascular: Normal heart rate noted  Respiratory: Normal respiratory effort, no problems with respiration noted  Abdomen: Soft, gravid, appropriate for gestational age. Pain/Pressure: Present     Pelvic:  Cervical exam deferred        Extremities: Normal range of motion.     Mental Status: Normal mood and affect. Normal behavior. Normal judgment and thought content.   Urinalysis:      Assessment and Plan:  Pregnancy: G2P1001 at 1149w6d  1. Rh negative state in antepartum period Rhogam today  2. Maternal morbid obesity, antepartum (HCC)   3. Supervision of other normal pregnancy, antepartum Stable  U/S 12/30/16 64 % growth  4. Benign gestational thrombocytopenia in third trimester [facility](HCC) Had same problem with prior pregnncy - CBC  Preterm labor symptoms and general obstetric precautions including but not limited to vaginal bleeding,  contractions, leaking of fluid and fetal movement were reviewed in detail with the patient. Please refer to After Visit Summary for other counseling recommendations.  Return in about 2 weeks (around 01/15/2017) for OB visit.   Hermina StaggersErvin, Levaughn Puccinelli L, MD

## 2017-01-01 NOTE — Addendum Note (Signed)
Addended by: Marya LandryFOSTER, Adhira Jamil D on: 01/01/2017 03:10 PM   Modules accepted: Orders

## 2017-01-02 LAB — CBC
Hematocrit: 35.8 % (ref 34.0–46.6)
Hemoglobin: 11.8 g/dL (ref 11.1–15.9)
MCH: 27.6 pg (ref 26.6–33.0)
MCHC: 33 g/dL (ref 31.5–35.7)
MCV: 84 fL (ref 79–97)
PLATELETS: 131 10*3/uL — AB (ref 150–379)
RBC: 4.27 x10E6/uL (ref 3.77–5.28)
RDW: 14.2 % (ref 12.3–15.4)
WBC: 8.3 10*3/uL (ref 3.4–10.8)

## 2017-01-09 ENCOUNTER — Telehealth: Payer: Self-pay

## 2017-01-09 NOTE — Telephone Encounter (Signed)
Pt called stating that she had a really bad headache and dizzy spell at work. Her job called EMS to come evaluate her. Pt states that her bp was 140/90 sitting and 155/84 standing. Pt was advised to call our office for advice. She declined going to [facility]U with EMS, and was advised by EMS to contact us. Pt advised to go to [facility]U for evaluation.

## 2017-01-16 ENCOUNTER — Encounter: Payer: Self-pay | Admitting: Obstetrics

## 2017-01-16 ENCOUNTER — Ambulatory Visit (INDEPENDENT_AMBULATORY_CARE_PROVIDER_SITE_OTHER): Payer: Medicaid Other | Admitting: Obstetrics & Gynecology

## 2017-01-16 VITALS — BP 124/78 | HR 96 | Wt 352.0 lb

## 2017-01-16 DIAGNOSIS — Z3480 Encounter for supervision of other normal pregnancy, unspecified trimester: Secondary | ICD-10-CM

## 2017-01-16 DIAGNOSIS — Z3483 Encounter for supervision of other normal pregnancy, third trimester: Secondary | ICD-10-CM

## 2017-01-16 NOTE — Progress Notes (Signed)
Pt complains of having swelling in feet, headaches, and visual changes for past 2 days.

## 2017-01-16 NOTE — Progress Notes (Signed)
   PRENATAL VISIT NOTE  Subjective:  Sylvia Chang is a 26 y.o. G2P1001 at 7976w0d being seen today for ongoing prenatal care.  She is currently monitored for the following issues for this high-risk pregnancy and has Gestational thrombocytopenia (HCC); Supervision of normal pregnancy, antepartum; Maternal morbid obesity, antepartum (HCC); Seasonal allergic rhinitis due to pollen; and Rh negative state in antepartum period on her problem list.  Patient reports swelling headache.  Contractions: Not present. Vag. Bleeding: None.  Movement: Present. Denies leaking of fluid.   The following portions of the patient's history were reviewed and updated as appropriate: allergies, current medications, past family history, past medical history, past social history, past surgical history and problem list. Problem list updated.  Objective:   Vitals:   01/16/17 1552  BP: (!) 142/90  Pulse: (!) 106  Weight: (!) 352 lb (159.7 kg)  Blood pressure 124/78, pulse 96, weight (!) 352 lb (159.7 kg), last menstrual period 05/21/2016.    Fetal Status:   Fundal Height: 33 cm Movement: Present     General:  Alert, oriented and cooperative. Patient is in no acute distress.  Skin: Skin is warm and dry. No rash noted.   Cardiovascular: Normal heart rate noted  Respiratory: Normal respiratory effort, no problems with respiration noted  Abdomen: Soft, gravid, appropriate for gestational age. Pain/Pressure: Present     Pelvic:  Cervical exam deferred        Extremities: Normal range of motion.  Edema: Moderate pitting, indentation subsides rapidly  Mental Status: Normal mood and affect. Normal behavior. Normal judgment and thought content.   Assessment and Plan:  Pregnancy: G2P1001 at 6776w0d  1. Supervision of other normal pregnancy, antepartum HTN noted - Comprehensive metabolic panel - Protein / creatinine ratio, urine - CBC  Preterm labor symptoms and general obstetric precautions including but not  limited to vaginal bleeding, contractions, leaking of fluid and fetal movement were reviewed in detail with the patient. Please refer to After Visit Summary for other counseling recommendations.  Return in about 4 days (around 01/20/2017). F/u labs and BP  [name], MD

## 2017-01-16 NOTE — Patient Instructions (Signed)
Hypertension During Pregnancy Hypertension is also called high blood pressure. High blood pressure means that the force of your blood moving in your body is too strong. When you are pregnant, this condition should be watched carefully. It can cause problems for you and your baby. Follow these instructions at home: Eating and drinking  Drink enough fluid to keep your pee (urine) clear or pale yellow.  Eat healthy foods that are low in salt (sodium). ? Do not add salt to your food. ? Check labels on foods and drinks to see much salt is in them. Look on the label where you see "Sodium." Lifestyle  Do not use any products that contain nicotine or tobacco, such as cigarettes and e-cigarettes. If you need help quitting, ask your doctor.  Do not use alcohol.  Avoid caffeine.  Avoid stress. Rest and get plenty of sleep. General instructions  Take over-the-counter and prescription medicines only as told by your doctor.  While lying down, lie on your left side. This keeps pressure off your baby.  While sitting or lying down, raise (elevate) your feet. Try putting some pillows under your lower legs.  Exercise regularly. Ask your doctor what kinds of exercise are best for you.  Keep all prenatal and follow-up visits as told by your doctor. This is important. Contact a doctor if:  You have symptoms that your doctor told you to watch for, such as: ? Fever. ? Throwing up (vomiting). ? Headache. Get help right away if:  You have very bad pain in your belly (abdomen).  You are throwing up, and this does not get better with treatment.  You suddenly get swelling in your hands, ankles, or face.  You gain 4 lb (1.8 kg) or more in 1 week.  You get bleeding from your vagina.  You have blood in your pee.  You do not feel your baby moving as much as normal.  You have a change in vision.  You have muscle twitching or sudden tightening (spasms).  You have trouble breathing.  Your lips  or fingernails turn blue. This information is not intended to replace advice given to you by your health care provider. Make sure you discuss any questions you have with your health care provider. Document Released: 08/17/2010 Document Revised: 03/26/2016 Document Reviewed: 03/26/2016 Elsevier Interactive Patient Education  2017 [employer]vier Inc.  

## 2017-01-17 LAB — CBC
HEMATOCRIT: 36.2 % (ref 34.0–46.6)
HEMOGLOBIN: 11.9 g/dL (ref 11.1–15.9)
MCH: 28.3 pg (ref 26.6–33.0)
MCHC: 32.9 g/dL (ref 31.5–35.7)
MCV: 86 fL (ref 79–97)
Platelets: 122 10*3/uL — ABNORMAL LOW (ref 150–379)
RBC: 4.21 x10E6/uL (ref 3.77–5.28)
RDW: 14 % (ref 12.3–15.4)
WBC: 8.2 10*3/uL (ref 3.4–10.8)

## 2017-01-17 LAB — COMPREHENSIVE METABOLIC PANEL
ALT: 7 IU/L (ref 0–32)
AST: 13 IU/L (ref 0–40)
Albumin/Globulin Ratio: 1.2 (ref 1.2–2.2)
Albumin: 3.6 g/dL (ref 3.5–5.5)
Alkaline Phosphatase: 102 IU/L (ref 39–117)
BUN/Creatinine Ratio: 12 (ref 9–23)
BUN: 6 mg/dL (ref 6–20)
Bilirubin Total: <0.2 mg/dL (ref 0.0–1.2)
CALCIUM: 8.9 mg/dL (ref 8.7–10.2)
CO2: 17 mmol/L — AB (ref 20–29)
CREATININE: 0.52 mg/dL — AB (ref 0.57–1.00)
Chloride: 107 mmol/L — ABNORMAL HIGH (ref 96–106)
GFR, EST AFRICAN AMERICAN: 155 mL/min/{1.73_m2} (ref >59)
GFR, EST NON AFRICAN AMERICAN: 134 mL/min/{1.73_m2} (ref >59)
GLUCOSE: 72 mg/dL (ref 65–99)
Globulin, Total: 2.9 g/dL (ref 1.5–4.5)
Potassium: 4.6 mmol/L (ref 3.5–5.2)
Sodium: 140 mmol/L (ref 134–144)
TOTAL PROTEIN: 6.5 g/dL (ref 6.0–8.5)

## 2017-01-17 LAB — PROTEIN / CREATININE RATIO, URINE
Creatinine, Urine: 126 mg/dL
PROTEIN UR: 14.6 mg/dL
PROTEIN/CREAT RATIO: 116 mg/g{creat} (ref 0–200)

## 2017-01-21 ENCOUNTER — Ambulatory Visit (INDEPENDENT_AMBULATORY_CARE_PROVIDER_SITE_OTHER): Payer: Medicaid Other | Admitting: Obstetrics and Gynecology

## 2017-01-21 VITALS — BP 115/79 | HR 87 | Wt 350.4 lb

## 2017-01-21 DIAGNOSIS — O99210 Obesity complicating pregnancy, unspecified trimester: Secondary | ICD-10-CM

## 2017-01-21 DIAGNOSIS — Z6791 Unspecified blood type, Rh negative: Secondary | ICD-10-CM

## 2017-01-21 DIAGNOSIS — O99213 Obesity complicating pregnancy, third trimester: Secondary | ICD-10-CM

## 2017-01-21 DIAGNOSIS — Z3480 Encounter for supervision of other normal pregnancy, unspecified trimester: Secondary | ICD-10-CM

## 2017-01-21 DIAGNOSIS — O09893 Supervision of other high risk pregnancies, third trimester: Secondary | ICD-10-CM

## 2017-01-21 DIAGNOSIS — O26899 Other specified pregnancy related conditions, unspecified trimester: Secondary | ICD-10-CM

## 2017-01-21 DIAGNOSIS — O99113 Other diseases of the blood and blood-forming organs and certain disorders involving the immune mechanism complicating pregnancy, third trimester: Secondary | ICD-10-CM

## 2017-01-21 DIAGNOSIS — D696 Thrombocytopenia, unspecified: Secondary | ICD-10-CM

## 2017-01-21 NOTE — Progress Notes (Signed)
Patient reports good fetal movement with occasional pressure. 

## 2017-01-21 NOTE — Progress Notes (Signed)
   PRENATAL VISIT NOTE  Subjective:  Sylvia Chang is a 26 y.o. G2P1001 at 8866w5d being seen today for ongoing prenatal care.  She is currently monitored for the following issues for this high-risk pregnancy and has Gestational thrombocytopenia (HCC); Supervision of normal pregnancy, antepartum; Maternal morbid obesity, antepartum (HCC); Seasonal allergic rhinitis due to pollen; and Rh negative state in antepartum period on her problem list.  Patient reports no complaints.  Contractions: Not present. Vag. Bleeding: None.  Movement: Present. Denies leaking of fluid.   The following portions of the patient's history were reviewed and updated as appropriate: allergies, current medications, past family history, past medical history, past social history, past surgical history and problem list. Problem list updated.  Objective:   Vitals:   01/21/17 1353  BP: 115/79  Pulse: 87  Weight: (!) 350 lb 6.4 oz (158.9 kg)    Fetal Status: Fetal Heart Rate (bpm): 158 Fundal Height: 35 cm Movement: Present     General:  Alert, oriented and cooperative. Patient is in no acute distress.  Skin: Skin is warm and dry. No rash noted.   Cardiovascular: Normal heart rate noted  Respiratory: Normal respiratory effort, no problems with respiration noted  Abdomen: Soft, gravid, appropriate for gestational age. Pain/Pressure: Present     Pelvic:  Cervical exam deferred        Extremities: Normal range of motion.  Edema: Trace  Mental Status: Normal mood and affect. Normal behavior. Normal judgment and thought content.   Assessment and Plan:  Pregnancy: G2P1001 at 4566w5d  1. Supervision of other normal pregnancy, antepartum Patient is doing well without complaints She is considering BTL vs Paraguard IUD- Medicaid form signed today Follow up growth ultrasound on 7/5  2. Benign gestational thrombocytopenia in third trimester (HCC) Will repeat cbc at 36 week visit  3. Rh negative state in antepartum  period S/p rhogam  4. Maternal morbid obesity, antepartum (HCC)   Preterm labor symptoms and general obstetric precautions including but not limited to vaginal bleeding, contractions, leaking of fluid and fetal movement were reviewed in detail with the patient. Please refer to After Visit Summary for other counseling recommendations.  Return in about 2 weeks (around 02/04/2017) for ROB.   Catalina AntiguaPeggy Mercie Balsley, MD

## 2017-01-30 ENCOUNTER — Ambulatory Visit (HOSPITAL_COMMUNITY)
Admission: RE | Admit: 2017-01-30 | Discharge: 2017-01-30 | Disposition: A | Discharge status: Home / Self Care | Payer: Medicaid Other | Source: Ambulatory Visit | Attending: Obstetrics and Gynecology | Admitting: Obstetrics and Gynecology

## 2017-01-30 ENCOUNTER — Other Ambulatory Visit: Payer: Self-pay | Admitting: Obstetrics and Gynecology

## 2017-01-30 DIAGNOSIS — Z3A35 35 weeks gestation of pregnancy: Secondary | ICD-10-CM | POA: Diagnosis not present

## 2017-01-30 DIAGNOSIS — O99213 Obesity complicating pregnancy, third trimester: Secondary | ICD-10-CM

## 2017-01-30 DIAGNOSIS — Z3689 Encounter for other specified antenatal screening: Secondary | ICD-10-CM | POA: Diagnosis not present

## 2017-01-30 DIAGNOSIS — E669 Obesity, unspecified: Secondary | ICD-10-CM | POA: Insufficient documentation

## 2017-02-05 ENCOUNTER — Ambulatory Visit (INDEPENDENT_AMBULATORY_CARE_PROVIDER_SITE_OTHER): Payer: Medicaid Other | Admitting: Obstetrics & Gynecology

## 2017-02-05 ENCOUNTER — Other Ambulatory Visit (HOSPITAL_COMMUNITY)
Admission: RE | Admit: 2017-02-05 | Discharge: 2017-02-05 | Disposition: A | Discharge status: Home / Self Care | Payer: Medicaid Other | Source: Ambulatory Visit | Attending: Obstetrics & Gynecology | Admitting: Obstetrics & Gynecology

## 2017-02-05 ENCOUNTER — Encounter: Payer: Self-pay | Admitting: *Deleted

## 2017-02-05 VITALS — BP 119/82 | HR 97 | Wt 355.7 lb

## 2017-02-05 DIAGNOSIS — Z3483 Encounter for supervision of other normal pregnancy, third trimester: Secondary | ICD-10-CM

## 2017-02-05 DIAGNOSIS — Z3480 Encounter for supervision of other normal pregnancy, unspecified trimester: Secondary | ICD-10-CM | POA: Insufficient documentation

## 2017-02-05 NOTE — Progress Notes (Signed)
   PRENATAL VISIT NOTE  Subjective:  Sylvia Chang is a 26 y.o. G2P1001 at 351w6d being seen today for ongoing prenatal care.  She is currently monitored for the following issues for this low-risk pregnancy and has Gestational thrombocytopenia (HCC); Supervision of normal pregnancy, antepartum; Maternal morbid obesity, antepartum (HCC); Seasonal allergic rhinitis due to pollen; and Rh negative state in antepartum period on her problem list.  Patient reports no complaints.  Contractions: Irritability. Vag. Bleeding: None.  Movement: Present. Denies leaking of fluid.   The following portions of the patient's history were reviewed and updated as appropriate: allergies, current medications, past family history, past medical history, past social history, past surgical history and problem list. Problem list updated.  Objective:   Vitals:   02/05/17 1452  BP: 119/82  Pulse: 97  Weight: (!) 355 lb 11.2 oz (161.3 kg)    Fetal Status: Fetal Heart Rate (bpm): 146   Movement: Present     General:  Alert, oriented and cooperative. Patient is in no acute distress.  Skin: Skin is warm and dry. No rash noted.   Cardiovascular: Normal heart rate noted  Respiratory: Normal respiratory effort, no problems with respiration noted  Abdomen: Soft, gravid, appropriate for gestational age. Pain/Pressure: Present     Pelvic:  Cervical exam performed        Extremities: Normal range of motion.  Edema: Moderate pitting, indentation subsides rapidly  Mental Status: Normal mood and affect. Normal behavior. Normal judgment and thought content.   Assessment and Plan:  Pregnancy: G2P1001 at 3651w6d  1. Supervision of other normal pregnancy, antepartum  - Strep Gp B NAA - Cervicovaginal ancillary only - CBC  Preterm labor symptoms and general obstetric precautions including but not limited to vaginal bleeding, contractions, leaking of fluid and fetal movement were reviewed in detail with the patient. Please  refer to After Visit Summary for other counseling recommendations.  Return in about 1 week (around 02/12/2017). Recheck platelet count today per Dr Jolayne Pantheronstant  [name], MD

## 2017-02-05 NOTE — Patient Instructions (Signed)
Third Trimester of Pregnancy The third trimester is from week 28 through week 40 (months 7 through 9). The third trimester is a time when the unborn baby (fetus) is growing rapidly. At the end of the ninth month, the fetus is about 20 inches in length and weighs 6-10 pounds. Body changes during your third trimester Your body will continue to go through many changes during pregnancy. The changes vary from woman to woman. During the third trimester:  Your weight will continue to increase. You can expect to gain 25-35 pounds (11-16 kg) by the end of the pregnancy.  You may begin to get stretch marks on your hips, abdomen, and breasts.  You may urinate more often because the fetus is moving lower into your pelvis and pressing on your bladder.  You may develop or continue to have heartburn. This is caused by increased hormones that slow down muscles in the digestive tract.  You may develop or continue to have constipation because increased hormones slow digestion and cause the muscles that push waste through your intestines to relax.  You may develop hemorrhoids. These are swollen veins (varicose veins) in the rectum that can itch or be painful.  You may develop swollen, bulging veins (varicose veins) in your legs.  You may have increased body aches in the pelvis, back, or thighs. This is due to weight gain and increased hormones that are relaxing your joints.  You may have changes in your hair. These can include thickening of your hair, rapid growth, and changes in texture. Some women also have hair loss during or after pregnancy, or hair that feels dry or thin. Your hair will most likely return to normal after your baby is born.  Your breasts will continue to grow and they will continue to become tender. A yellow fluid (colostrum) may leak from your breasts. This is the first milk you are producing for your baby.  Your belly button may stick out.  You may notice more swelling in your hands,  face, or ankles.  You may have increased tingling or numbness in your hands, arms, and legs. The skin on your belly may also feel numb.  You may feel short of breath because of your expanding uterus.  You may have more problems sleeping. This can be caused by the size of your belly, increased need to urinate, and an increase in your body's metabolism.  You may notice the fetus "dropping," or moving lower in your abdomen (lightening).  You may have increased vaginal discharge.  You may notice your joints feel loose and you may have pain around your pelvic bone.  What to expect at prenatal visits You will have prenatal exams every 2 weeks until week 36. Then you will have weekly prenatal exams. During a routine prenatal visit:  You will be weighed to make sure you and the baby are growing normally.  Your blood pressure will be taken.  Your abdomen will be measured to track your baby's growth.  The fetal heartbeat will be listened to.  Any test results from the previous visit will be discussed.  You may have a cervical check near your due date to see if your cervix has softened or thinned (effaced).  You will be tested for Group B streptococcus. This happens between 35 and 37 weeks.  Your health care provider may ask you:  What your birth plan is.  How you are feeling.  If you are feeling the baby move.  If you have had   any abnormal symptoms, such as leaking fluid, bleeding, severe headaches, or abdominal cramping.  If you are using any tobacco products, including cigarettes, chewing tobacco, and electronic cigarettes.  If you have any questions.  Other tests or screenings that may be performed during your third trimester include:  Blood tests that check for low iron levels (anemia).  Fetal testing to check the health, activity level, and growth of the fetus. Testing is done if you have certain medical conditions or if there are problems during the  pregnancy.  Nonstress test (NST). This test checks the health of your baby to make sure there are no signs of problems, such as the baby not getting enough oxygen. During this test, a belt is placed around your belly. The baby is made to move, and its heart rate is monitored during movement.  What is false labor? False labor is a condition in which you feel small, irregular tightenings of the muscles in the womb (contractions) that usually go away with rest, changing position, or drinking water. These are called Braxton Hicks contractions. Contractions may last for hours, days, or even weeks before true labor sets in. If contractions come at regular intervals, become more frequent, increase in intensity, or become painful, you should see your health care provider. What are the signs of labor?  Abdominal cramps.  Regular contractions that start at 10 minutes apart and become stronger and more frequent with time.  Contractions that start on the top of the uterus and spread down to the lower abdomen and back.  Increased pelvic pressure and dull back pain.  A watery or bloody mucus discharge that comes from the vagina.  Leaking of amniotic fluid. This is also known as your "water breaking." It could be a slow trickle or a gush. Let your health care provider know if it has a color or strange odor. If you have any of these signs, call your health care provider right away, even if it is before your due date. Follow these instructions at home: Medicines  Follow your health care provider's instructions regarding medicine use. Specific medicines may be either safe or unsafe to take during pregnancy.  Take a prenatal vitamin that contains at least 600 micrograms (mcg) of folic acid.  If you develop constipation, try taking a stool softener if your health care provider approves. Eating and drinking  Eat a balanced diet that includes fresh fruits and vegetables, whole grains, good sources of protein  such as meat, eggs, or tofu, and low-fat dairy. Your health care provider will help you determine the amount of weight gain that is right for you.  Avoid raw meat and uncooked cheese. These carry germs that can cause birth defects in the baby.  If you have low calcium intake from food, talk to your health care provider about whether you should take a daily calcium supplement.  Eat four or five small meals rather than three large meals a day.  Limit foods that are high in fat and processed sugars, such as fried and sweet foods.  To prevent constipation: ? Drink enough fluid to keep your urine clear or pale yellow. ? Eat foods that are high in fiber, such as fresh fruits and vegetables, whole grains, and beans. Activity  Exercise only as directed by your health care provider. Most women can continue their usual exercise routine during pregnancy. Try to exercise for 30 minutes at least 5 days a week. Stop exercising if you experience uterine contractions.  Avoid heavy   lifting.  Do not exercise in extreme heat or humidity, or at high altitudes.  Wear low-heel, comfortable shoes.  Practice good posture.  You may continue to have sex unless your health care provider tells you otherwise. Relieving pain and discomfort  Take frequent breaks and rest with your legs elevated if you have leg cramps or low back pain.  Take warm sitz baths to soothe any pain or discomfort caused by hemorrhoids. Use hemorrhoid cream if your health care provider approves.  Wear a good support bra to prevent discomfort from breast tenderness.  If you develop varicose veins: ? Wear support pantyhose or compression stockings as told by your healthcare provider. ? Elevate your feet for 15 minutes, 3-4 times a day. Prenatal care  Write down your questions. Take them to your prenatal visits.  Keep all your prenatal visits as told by your health care provider. This is important. Safety  Wear your seat belt at  all times when driving.  Make a list of emergency phone numbers, including numbers for family, friends, the hospital, and police and fire departments. General instructions  Avoid cat litter boxes and soil used by cats. These carry germs that can cause birth defects in the baby. If you have a cat, ask someone to clean the litter box for you.  Do not travel far distances unless it is absolutely necessary and only with the approval of your health care provider.  Do not use hot tubs, steam rooms, or saunas.  Do not drink alcohol.  Do not use any products that contain nicotine or tobacco, such as cigarettes and e-cigarettes. If you need help quitting, ask your health care provider.  Do not use any medicinal herbs or unprescribed drugs. These chemicals affect the formation and growth of the baby.  Do not douche or use tampons or scented sanitary pads.  Do not cross your legs for long periods of time.  To prepare for the arrival of your baby: ? Take prenatal classes to understand, practice, and ask questions about labor and delivery. ? Make a trial run to the hospital. ? Visit the hospital and tour the maternity area. ? Arrange for maternity or paternity leave through employers. ? Arrange for family and friends to take care of pets while you are in the hospital. ? Purchase a rear-facing car seat and make sure you know how to install it in your car. ? Pack your hospital bag. ? Prepare the baby's nursery. Make sure to remove all pillows and stuffed animals from the baby's crib to prevent suffocation.  Visit your dentist if you have not gone during your pregnancy. Use a soft toothbrush to brush your teeth and be gentle when you floss. Contact a health care provider if:  You are unsure if you are in labor or if your water has broken.  You become dizzy.  You have mild pelvic cramps, pelvic pressure, or nagging pain in your abdominal area.  You have lower back pain.  You have persistent  nausea, vomiting, or diarrhea.  You have an unusual or bad smelling vaginal discharge.  You have pain when you urinate. Get help right away if:  Your water breaks before 37 weeks.  You have regular contractions less than 5 minutes apart before 37 weeks.  You have a fever.  You are leaking fluid from your vagina.  You have spotting or bleeding from your vagina.  You have severe abdominal pain or cramping.  You have rapid weight loss or weight gain.    You have shortness of breath with chest pain.  You notice sudden or extreme swelling of your face, hands, ankles, feet, or legs.  Your baby makes fewer than 10 movements in 2 hours.  You have severe headaches that do not go away when you take medicine.  You have vision changes. Summary  The third trimester is from week 28 through week 40, months 7 through 9. The third trimester is a time when the unborn baby (fetus) is growing rapidly.  During the third trimester, your discomfort may increase as you and your baby continue to gain weight. You may have abdominal, leg, and back pain, sleeping problems, and an increased need to urinate.  During the third trimester your breasts will keep growing and they will continue to become tender. A yellow fluid (colostrum) may leak from your breasts. This is the first milk you are producing for your baby.  False labor is a condition in which you feel small, irregular tightenings of the muscles in the womb (contractions) that eventually go away. These are called Braxton Hicks contractions. Contractions may last for hours, days, or even weeks before true labor sets in.  Signs of labor can include: abdominal cramps; regular contractions that start at 10 minutes apart and become stronger and more frequent with time; watery or bloody mucus discharge that comes from the vagina; increased pelvic pressure and dull back pain; and leaking of amniotic fluid. This information is not intended to replace advice  given to you by your health care provider. Make sure you discuss any questions you have with your health care provider. Document Released: 07/09/2001 Document Revised: 12/21/2015 Document Reviewed: 09/15/2012 Elsevier Interactive Patient Education  2017 [employer]  

## 2017-02-06 LAB — CBC
HEMATOCRIT: 35.1 % (ref 34.0–46.6)
Hemoglobin: 11.7 g/dL (ref 11.1–15.9)
MCH: 28 pg (ref 26.6–33.0)
MCHC: 33.3 g/dL (ref 31.5–35.7)
MCV: 84 fL (ref 79–97)
PLATELETS: 129 10*3/uL — AB (ref 150–379)
RBC: 4.18 x10E6/uL (ref 3.77–5.28)
RDW: 14.3 % (ref 12.3–15.4)
WBC: 8 10*3/uL (ref 3.4–10.8)

## 2017-02-06 LAB — CERVICOVAGINAL ANCILLARY ONLY
Bacterial vaginitis: NEGATIVE
Candida vaginitis: NEGATIVE
Chlamydia: NEGATIVE
Neisseria Gonorrhea: NEGATIVE
Trichomonas: NEGATIVE

## 2017-02-07 LAB — STREP GP B NAA: STREP GROUP B AG: NEGATIVE

## 2017-02-13 ENCOUNTER — Ambulatory Visit (INDEPENDENT_AMBULATORY_CARE_PROVIDER_SITE_OTHER): Payer: Medicaid Other | Admitting: Obstetrics

## 2017-02-13 ENCOUNTER — Encounter: Payer: Self-pay | Admitting: Obstetrics

## 2017-02-13 ENCOUNTER — Encounter: Payer: Self-pay | Admitting: *Deleted

## 2017-02-13 DIAGNOSIS — Z3480 Encounter for supervision of other normal pregnancy, unspecified trimester: Secondary | ICD-10-CM

## 2017-02-13 DIAGNOSIS — Z3483 Encounter for supervision of other normal pregnancy, third trimester: Secondary | ICD-10-CM

## 2017-02-13 DIAGNOSIS — Z3493 Encounter for supervision of normal pregnancy, unspecified, third trimester: Secondary | ICD-10-CM

## 2017-02-13 NOTE — Progress Notes (Signed)
Subjective:  Sylvia Chang is a 26 y.o. G2P1001 at 492w0d being seen today for ongoing prenatal care.  She is currently monitored for the following issues for this low-risk pregnancy and has Gestational thrombocytopenia (HCC); Supervision of normal pregnancy, antepartum; Maternal morbid obesity, antepartum (HCC); Seasonal allergic rhinitis due to pollen; and Rh negative state in antepartum period on her problem list.  Patient reports no complaints and seasonal allergies.  Contractions: Irregular. Vag. Bleeding: Scant.  Movement: Present. Denies leaking of fluid.   The following portions of the patient's history were reviewed and updated as appropriate: allergies, current medications, past family history, past medical history, past social history, past surgical history and problem list. Problem list updated.  Objective:   Vitals:   02/13/17 0946  BP: 122/80  Pulse: (!) 105  Weight: (!) 354 lb 14.4 oz (161 kg)    Fetal Status: Fetal Heart Rate (bpm): 140   Movement: Present     General:  Alert, oriented and cooperative. Patient is in no acute distress.  Skin: Skin is warm and dry. No rash noted.   Cardiovascular: Normal heart rate noted  Respiratory: Normal respiratory effort, no problems with respiration noted  Abdomen: Soft, gravid, appropriate for gestational age. Pain/Pressure: Present     Pelvic:  Cervical exam deferred        Extremities: Normal range of motion.  Edema: Mild pitting, slight indentation  Mental Status: Normal mood and affect. Normal behavior. Normal judgment and thought content.   Urinalysis:      Assessment and Plan:  Pregnancy: G2P1001 at 722w0d  1. Supervision of other normal pregnancy, antepartum   Preterm labor symptoms and general obstetric precautions including but not limited to vaginal bleeding, contractions, leaking of fluid and fetal movement were reviewed in detail with the patient. Please refer to After Visit Summary for other counseling  recommendations.  Return in about 1 week (around 02/20/2017) for ROB.   Brock BadHarper, Shany Marinez A, MD

## 2017-02-13 NOTE — Progress Notes (Signed)
Patient had light spotting last night, it went away this morning.

## 2017-02-21 ENCOUNTER — Encounter: Payer: Self-pay | Admitting: Obstetrics

## 2017-02-21 ENCOUNTER — Ambulatory Visit (INDEPENDENT_AMBULATORY_CARE_PROVIDER_SITE_OTHER): Payer: Medicaid Other | Admitting: Obstetrics

## 2017-02-21 DIAGNOSIS — D696 Thrombocytopenia, unspecified: Secondary | ICD-10-CM

## 2017-02-21 DIAGNOSIS — O99210 Obesity complicating pregnancy, unspecified trimester: Secondary | ICD-10-CM

## 2017-02-21 DIAGNOSIS — O26899 Other specified pregnancy related conditions, unspecified trimester: Secondary | ICD-10-CM

## 2017-02-21 DIAGNOSIS — O99213 Obesity complicating pregnancy, third trimester: Secondary | ICD-10-CM

## 2017-02-21 DIAGNOSIS — O99113 Other diseases of the blood and blood-forming organs and certain disorders involving the immune mechanism complicating pregnancy, third trimester: Secondary | ICD-10-CM

## 2017-02-21 DIAGNOSIS — Z6791 Unspecified blood type, Rh negative: Secondary | ICD-10-CM

## 2017-02-21 DIAGNOSIS — O09893 Supervision of other high risk pregnancies, third trimester: Secondary | ICD-10-CM

## 2017-02-21 DIAGNOSIS — O99119 Other diseases of the blood and blood-forming organs and certain disorders involving the immune mechanism complicating pregnancy, unspecified trimester: Principal | ICD-10-CM

## 2017-02-21 NOTE — Progress Notes (Signed)
Patient has a question about primrose oil.

## 2017-02-21 NOTE — Progress Notes (Signed)
Subjective:  Sylvia Chang is a 26 y.o. G2P1001 at 242w1d being seen today for ongoing prenatal care.  She is currently monitored for the following issues for this high-risk pregnancy and has Gestational thrombocytopenia (HCC); Supervision of normal pregnancy, antepartum; Maternal morbid obesity, antepartum (HCC); Seasonal allergic rhinitis due to pollen; and Rh negative state in antepartum period on her problem list.  Patient reports no complaints.  Contractions: Irregular. Vag. Bleeding: None.  Movement: Present. Denies leaking of fluid.   The following portions of the patient's history were reviewed and updated as appropriate: allergies, current medications, past family history, past medical history, past social history, past surgical history and problem list. Problem list updated.  Objective:   Vitals:   02/21/17 0914  BP: 131/82  Pulse: 86  Weight: (!) 354 lb 12.8 oz (160.9 kg)    Fetal Status: Fetal Heart Rate (bpm): 140   Movement: Present     General:  Alert, oriented and cooperative. Patient is in no acute distress.  Skin: Skin is warm and dry. No rash noted.   Cardiovascular: Normal heart rate noted  Respiratory: Normal respiratory effort, no problems with respiration noted  Abdomen: Soft, gravid, appropriate for gestational age. Pain/Pressure: Present     Pelvic:  Cervical exam deferred        Extremities: Normal range of motion.  Edema: Trace  Mental Status: Normal mood and affect. Normal behavior. Normal judgment and thought content.   Urinalysis:      Assessment and Plan:  Pregnancy: G2P1001 at 292w1d  1. Thrombocytopenia affecting pregnancy, antepartum (HCC) Rx: - CBC  2. Rh negative state in antepartum period   3. Maternal morbid obesity, antepartum (HCC)   Term labor symptoms and general obstetric precautions including but not limited to vaginal bleeding, contractions, leaking of fluid and fetal movement were reviewed in detail with the patient. Please  refer to After Visit Summary for other counseling recommendations.  Return in about 1 week (around 02/28/2017) for ROB.   Brock BadHarper, Charles A, MDPatient ID: Sylvia KeensVeronica D Teigen, female   DOB: [birth year], 26 y.o.   MRN: [id number]

## 2017-02-22 LAB — CBC
Hematocrit: 34.5 % (ref 34.0–46.6)
Hemoglobin: 11.6 g/dL (ref 11.1–15.9)
MCH: 27.4 pg (ref 26.6–33.0)
MCHC: 33.6 g/dL (ref 31.5–35.7)
MCV: 81 fL (ref 79–97)
PLATELETS: 117 10*3/uL — AB (ref 150–379)
RBC: 4.24 x10E6/uL (ref 3.77–5.28)
RDW: 14.4 % (ref 12.3–15.4)
WBC: 7.6 10*3/uL (ref 3.4–10.8)

## 2017-02-25 ENCOUNTER — Encounter (HOSPITAL_COMMUNITY): Payer: Self-pay | Admitting: *Deleted

## 2017-02-25 ENCOUNTER — Inpatient Hospital Stay (HOSPITAL_COMMUNITY)
Admission: AD | Admit: 2017-02-25 | Discharge: 2017-02-27 | DRG: 775 | Disposition: A | Discharge status: Home / Self Care | Payer: Medicaid Other | Source: Ambulatory Visit | Attending: Family Medicine | Admitting: Family Medicine

## 2017-02-25 ENCOUNTER — Encounter: Payer: Self-pay | Admitting: Obstetrics

## 2017-02-25 ENCOUNTER — Ambulatory Visit (INDEPENDENT_AMBULATORY_CARE_PROVIDER_SITE_OTHER): Payer: Medicaid Other | Admitting: Obstetrics

## 2017-02-25 DIAGNOSIS — O26893 Other specified pregnancy related conditions, third trimester: Secondary | ICD-10-CM | POA: Diagnosis present

## 2017-02-25 DIAGNOSIS — Z87891 Personal history of nicotine dependence: Secondary | ICD-10-CM | POA: Diagnosis not present

## 2017-02-25 DIAGNOSIS — Z6791 Unspecified blood type, Rh negative: Secondary | ICD-10-CM | POA: Diagnosis not present

## 2017-02-25 DIAGNOSIS — O9912 Other diseases of the blood and blood-forming organs and certain disorders involving the immune mechanism complicating childbirth: Secondary | ICD-10-CM | POA: Diagnosis present

## 2017-02-25 DIAGNOSIS — O0993 Supervision of high risk pregnancy, unspecified, third trimester: Secondary | ICD-10-CM

## 2017-02-25 DIAGNOSIS — Z3A38 38 weeks gestation of pregnancy: Secondary | ICD-10-CM

## 2017-02-25 DIAGNOSIS — D6959 Other secondary thrombocytopenia: Secondary | ICD-10-CM | POA: Diagnosis present

## 2017-02-25 DIAGNOSIS — O99213 Obesity complicating pregnancy, third trimester: Secondary | ICD-10-CM

## 2017-02-25 DIAGNOSIS — Z6841 Body Mass Index (BMI) 40.0 and over, adult: Secondary | ICD-10-CM

## 2017-02-25 DIAGNOSIS — O99210 Obesity complicating pregnancy, unspecified trimester: Secondary | ICD-10-CM

## 2017-02-25 DIAGNOSIS — O134 Gestational [pregnancy-induced] hypertension without significant proteinuria, complicating childbirth: Principal | ICD-10-CM | POA: Diagnosis present

## 2017-02-25 DIAGNOSIS — O139 Gestational [pregnancy-induced] hypertension without significant proteinuria, unspecified trimester: Secondary | ICD-10-CM | POA: Diagnosis present

## 2017-02-25 DIAGNOSIS — D696 Thrombocytopenia, unspecified: Secondary | ICD-10-CM

## 2017-02-25 DIAGNOSIS — O99214 Obesity complicating childbirth: Secondary | ICD-10-CM | POA: Diagnosis present

## 2017-02-25 DIAGNOSIS — I10 Essential (primary) hypertension: Secondary | ICD-10-CM

## 2017-02-25 DIAGNOSIS — O99113 Other diseases of the blood and blood-forming organs and certain disorders involving the immune mechanism complicating pregnancy, third trimester: Secondary | ICD-10-CM

## 2017-02-25 DIAGNOSIS — O99119 Other diseases of the blood and blood-forming organs and certain disorders involving the immune mechanism complicating pregnancy, unspecified trimester: Secondary | ICD-10-CM

## 2017-02-25 LAB — TYPE AND SCREEN
ABO/RH(D): A NEG
ANTIBODY SCREEN: NEGATIVE

## 2017-02-25 LAB — COMPREHENSIVE METABOLIC PANEL
ALBUMIN: 3.2 g/dL — AB (ref 3.5–5.0)
ALK PHOS: 119 U/L (ref 38–126)
ALT: 11 U/L — ABNORMAL LOW (ref 14–54)
ANION GAP: 9 (ref 5–15)
AST: 23 U/L (ref 15–41)
BILIRUBIN TOTAL: 0.2 mg/dL — AB (ref 0.3–1.2)
BUN: 9 mg/dL (ref 6–20)
CALCIUM: 9.3 mg/dL (ref 8.9–10.3)
CO2: 20 mmol/L — AB (ref 22–32)
Chloride: 107 mmol/L (ref 101–111)
Creatinine, Ser: 0.44 mg/dL (ref 0.44–1.00)
GFR calc Af Amer: >60 mL/min (ref >60)
GFR calc non Af Amer: >60 mL/min (ref >60)
GLUCOSE: 75 mg/dL (ref 65–99)
Potassium: 4.3 mmol/L (ref 3.5–5.1)
SODIUM: 136 mmol/L (ref 135–145)
TOTAL PROTEIN: 6.8 g/dL (ref 6.5–8.1)

## 2017-02-25 LAB — CBC
HEMATOCRIT: 37.1 % (ref 36.0–46.0)
HEMOGLOBIN: 12.5 g/dL (ref 12.0–15.0)
MCH: 28.2 pg (ref 26.0–34.0)
MCHC: 33.7 g/dL (ref 30.0–36.0)
MCV: 83.7 fL (ref 78.0–100.0)
Platelets: 124 10*3/uL — ABNORMAL LOW (ref 150–400)
RBC: 4.43 MIL/uL (ref 3.87–5.11)
RDW: 14.2 % (ref 11.5–15.5)
WBC: 8.8 10*3/uL (ref 4.0–10.5)

## 2017-02-25 LAB — URINALYSIS, COMPLETE (UACMP) WITH MICROSCOPIC
BILIRUBIN URINE: NEGATIVE
Glucose, UA: NEGATIVE mg/dL
Hgb urine dipstick: NEGATIVE
Ketones, ur: 5 mg/dL — AB
LEUKOCYTES UA: NEGATIVE
Nitrite: NEGATIVE
PH: 5 (ref 5.0–8.0)
Protein, ur: NEGATIVE mg/dL
SPECIFIC GRAVITY, URINE: 1.023 (ref 1.005–1.030)

## 2017-02-25 LAB — PROTEIN / CREATININE RATIO, URINE
CREATININE, URINE: 140 mg/dL
PROTEIN CREATININE RATIO: 0.06 mg/mg{creat} (ref 0.00–0.15)
TOTAL PROTEIN, URINE: 9 mg/dL

## 2017-02-25 MED ORDER — OXYTOCIN 40 UNITS IN LACTATED RINGERS INFUSION - SIMPLE MED: 2.5000 [IU]/h | INTRAVENOUS | Status: DC

## 2017-02-25 MED ORDER — OXYTOCIN 40 UNITS IN LACTATED RINGERS INFUSION - SIMPLE MED
1.0000 m[IU]/min | INTRAVENOUS | Status: DC
  Administered 2017-02-25: 2 m[IU]/min via INTRAVENOUS
  Filled 2017-02-25: qty 1000

## 2017-02-25 MED ORDER — LIDOCAINE HCL (PF) 1 % IJ SOLN
30.0000 mL | INTRAMUSCULAR | Status: DC | PRN
  Filled 2017-02-25: qty 30

## 2017-02-25 MED ORDER — LACTATED RINGERS IV SOLN
INTRAVENOUS | Status: DC
  Administered 2017-02-25: 21:00:00 via INTRAVENOUS

## 2017-02-25 MED ORDER — NALBUPHINE HCL 10 MG/ML IJ SOLN: 5.0000 mg | INTRAMUSCULAR | Status: DC | PRN

## 2017-02-25 MED ORDER — MISOPROSTOL 25 MCG QUARTER TABLET
25.0000 ug | ORAL_TABLET | ORAL | Status: DC | PRN
  Administered 2017-02-25: 25 ug via VAGINAL
  Filled 2017-02-25: qty 1

## 2017-02-25 MED ORDER — LACTATED RINGERS IV SOLN: 500.0000 mL | INTRAVENOUS | Status: DC | PRN

## 2017-02-25 MED ORDER — SOD CITRATE-CITRIC ACID 500-334 MG/5ML PO SOLN: 30.0000 mL | ORAL | Status: DC | PRN

## 2017-02-25 MED ORDER — OXYCODONE-ACETAMINOPHEN 5-325 MG PO TABS: 1.0000 | ORAL_TABLET | ORAL | Status: DC | PRN

## 2017-02-25 MED ORDER — ACETAMINOPHEN 325 MG PO TABS: 650.0000 mg | ORAL_TABLET | ORAL | Status: DC | PRN

## 2017-02-25 MED ORDER — OXYTOCIN BOLUS FROM INFUSION
500.0000 mL | Freq: Once | INTRAVENOUS | Status: AC
  Administered 2017-02-26: 500 mL via INTRAVENOUS

## 2017-02-25 MED ORDER — TERBUTALINE SULFATE 1 MG/ML IJ SOLN
0.2500 mg | Freq: Once | INTRAMUSCULAR | Status: DC | PRN
  Filled 2017-02-25: qty 1

## 2017-02-25 MED ORDER — OXYCODONE-ACETAMINOPHEN 5-325 MG PO TABS: 2.0000 | ORAL_TABLET | ORAL | Status: DC | PRN

## 2017-02-25 MED ORDER — TERBUTALINE SULFATE 1 MG/ML IJ SOLN: 0.2500 mg | Freq: Once | INTRAMUSCULAR | Status: DC | PRN

## 2017-02-25 MED ORDER — ONDANSETRON HCL 4 MG/2ML IJ SOLN: 4.0000 mg | Freq: Four times a day (QID) | INTRAMUSCULAR | Status: DC | PRN

## 2017-02-25 NOTE — Progress Notes (Signed)
Subjective:  Sylvia KeensVeronica D Chang is a 26 y.o. G2P1001 at 5154w5d being seen today for ongoing prenatal care.  She is currently monitored for the following issues for this high-risk pregnancy and has Gestational thrombocytopenia (HCC); Supervision of normal pregnancy, antepartum; Maternal morbid obesity, antepartum (HCC); Seasonal allergic rhinitis due to pollen; and Rh negative state in antepartum period on her problem list.  Patient reports headache, occasional contractions and swelling of hands and face.  Contractions: Irregular. Vag. Bleeding: None.  Movement: Present. Denies leaking of fluid.   The following portions of the patient's history were reviewed and updated as appropriate: allergies, current medications, past family history, past medical history, past social history, past surgical history and problem list. Problem list updated.  Objective:   Vitals:   02/25/17 1355 02/25/17 1408  BP: (!) 158/94 138/84  Pulse: (!) 101   Weight: (!) 354 lb 12.8 oz (160.9 kg)     Fetal Status:     Movement: Present     General:  Alert, oriented and cooperative. Patient is in no acute distress.  Skin: Skin is warm and dry. No rash noted.   Cardiovascular: Normal heart rate noted  Respiratory: Normal respiratory effort, no problems with respiration noted  Abdomen: Soft, gravid, appropriate for gestational age. Pain/Pressure: Present     Pelvic:  Cervical exam deferred        Extremities: Normal range of motion.  Edema: Mild pitting, slight indentation  Mental Status: Normal mood and affect. Normal behavior. Normal judgment and thought content.   Urinalysis: Urine Protein: Trace Urine Glucose: Negative  Assessment and Plan:  Pregnancy: G2P1001 at 8254w5d  1. Supervision of high risk pregnancy, antepartum, third trimester   2. Maternal morbid obesity, antepartum (HCC)   3. Thrombocytopenia affecting pregnancy, antepartum (HCC) - platelets stable at 117K on 02-21-17  4. Mild HTN - associated  with swelling of hands and face and headaches over the past week - [facility] with baseline PIH labs and NST  Term labor symptoms and general obstetric precautions including but not limited to vaginal bleeding, contractions, leaking of fluid and fetal movement were reviewed in detail with the patient. Please refer to After Visit Summary for other counseling recommendations.  Return in about 1 week (around 03/04/2017) for ROB.   Brock BadHarper, Kahlen Boyde A, MDPatient ID: Sylvia Chang, female   DOB: 1991/07/06, 26 y.o.   MRN: [id number]

## 2017-02-25 NOTE — Progress Notes (Signed)
Patient ID: Coralie KeensVeronica D Diener, female   DOB: 12-10-1990, 26 y.o.   MRN: [id number] Labor Progress Note  S: Patient seen & examined for progress of labor. Patient comfortable. Denies HA, RUQ pain, and vision changes.  O: BP 127/87   Pulse (!) 106   Temp 98.1 F (36.7 C) (Oral)   Resp 18   Ht 5\' 3"  (1.6 m)   Wt (!) 160.6 kg (354 lb)   LMP 05/21/2016 (Exact Date)   BMI 62.71 kg/m   FHT: 145bpm, mod var, +accels, no decels TOCO: irregular, patient looks comfortable during contractions  CVE: Dilation: 1 Effacement (%): Thick Cervical Position: Posterior Station: -3 Presentation: Vertex Exam by:: Westley HummerJ Hartlee Amedee DO & K Booker CNM  A&P: 26 y.o. G2P1001 2972w5d for IOL for GHTN, gestational thrombocytopenia with h/o PPH with transfusion  Foley bulb placed @2115 , s/p cytotec x1 Continue induction management Continue to monitor BPs Anticipate SVD  [place] Korie Brabson, DO FM Resident PGY-1 02/25/2017 9:38 PM

## 2017-02-25 NOTE — Anesthesia Pain Management Evaluation Note (Signed)
  CRNA Pain Management Visit Note  Patient: Sylvia KeensVeronica D Chang, 26 y.o., female  "Hello I am a member of the anesthesia team at [facility]. We have an anesthesia team available at all times to provide care throughout the hospital, including epidural management and anesthesia for C-section. I don't know your plan for the delivery whether it a natural birth, water birth, IV sedation, nitrous supplementation, doula or epidural, but we want to meet your pain goals."   1.Was your pain managed to your expectations on prior hospitalizations?   Yes   2.What is your expectation for pain management during this hospitalization?     Epidural  3.How can we help you reach that goal?   Record the patient's initial score and the patient's pain goal.   Pain: 3  Pain Goal: 6 [facility] wants you to be able to say your pain was always managed very well.  Sylvia Chang,Sylvia Chang 02/25/2017

## 2017-02-25 NOTE — H&P (Signed)
OBSTETRIC ADMISSION HISTORY AND PHYSICAL  Sylvia Chang is a 26 y.o. female G2P1001 with IUP at [redacted]w[redacted]d by 58 sono presenting for IOL for gHTN. She reports +FMs, No LOF, no VB, no blurry vision, headaches or peripheral edema, and RUQ pain.  She plans on breast feeding. She request ParaGuard for birth control. She received her prenatal care at [facility]   Dating: By 12 week sono --->  Estimated Date of Delivery: 03/06/17  Sono:    @[redacted]w[redacted]d , CWD, normal anatomy, cephalic presentation, 2611g, 16% EFW   Clinic [facility] Prenatal Labs  Dating  12 week sono Blood type: A/Negative/-- (01/16 1015)   Genetic Screen 1 Screen:nl     AFP:  Negative    Antibody:Negative (05/21 1213)  Anatomic [place] Inadequate at rescan at 22 weeks, rescan 26 w Rubella: 1.13 (01/16 1015)  GTT Early:  1 hr 99             Third trimester: 1hr 124 RPR: Non Reactive (05/21 1213)   Flu vaccine 08/13/2016 HBsAg: Negative (01/16 1015)   TDaP vaccine 01-01-17                              Rhogam: 01-01-17 HIV:   NR  Baby Food Breast                                    XWR:[id number] (07/11 1512)  Contraception IUD-Paragard Pap: negative   Circumcision no   Pediatrician [facility]   Support Person FOB-Mario    Prenatal History/Complications:  Past Medical History: Past Medical History:  Diagnosis Date  . Asthma   . Bronchitis   . Ovarian cyst   . Thrombocytopenia complicating pregnancy [facility])     Past Surgical History: Past Surgical History:  Procedure Laterality Date  . APPENDECTOMY    . SINUS SURGERY WITH INSTATRAK    . TONSILLECTOMY AND ADENOIDECTOMY    . TUBES IN EARS      Obstetrical History: OB History    Gravida Para Term Preterm AB Living   2 1 1     1    SAB TAB Ectopic Multiple Live Births           1      Social History: Social History   Social History  . Marital status: Single    Spouse name: N/A  . Number of children: N/A  . Years of education: N/A   Social History Main Topics  . Smoking  status: Former Smoker    Types: Cigarettes    Quit date: 09/20/2015  . Smokeless tobacco: Never Used  . Alcohol use No  . Drug use: No     Comment: pt states it has been years since using marijuana  . Sexual activity: Yes    Birth control/ protection: None, Condom   Other Topics Concern  . None   Social History Narrative  . None    Family History: Family History  Problem Relation Age of Onset  . Heart disease Mother        died at 13  . Hypertension Mother        died at 46  . Diabetes Father   . Diabetes Paternal Uncle   . Diabetes Paternal Grandfather     Allergies: No Known Allergies  Prescriptions Prior to Admission  Medication Sig Dispense Refill Last Dose  .  docusate sodium (COLACE) 100 MG capsule Take 200 mg by mouth daily as needed for mild constipation.   02/24/2017 at Unknown time  . famotidine (PEPCID) 40 MG tablet Take 1 tablet (40 mg total) by mouth every evening. 30 tablet 6 Past Month at Unknown time  . loratadine (CLARITIN) 10 MG tablet Take 1 tablet (10 mg total) by mouth daily. 30 tablet 11 Past Month at Unknown time  . Prenatal Multivit-Min-Fe-FA (PRENATAL VITAMINS) 0.8 MG tablet Take 1 tablet by mouth daily. 30 tablet 12 Past Week at Unknown time     Review of Systems   All systems reviewed and negative except as stated in HPI  Blood pressure 124/77, pulse (!) 118, temperature 98.8 F (37.1 C), temperature source Oral, resp. rate 18, height 5\' 3"  (1.6 m), weight (!) 160.6 kg (354 lb), last menstrual period 05/21/2016. General appearance: alert, cooperative and no distress Lungs: clear to auscultation bilaterally Heart: regular rate and rhythm Abdomen: soft, non-tender; bowel sounds normal Extremities: Homans sign is negative, no sign of DVT Presentation: cephalic Fetal monitoringBaseline: 145 bpm, Variability: Good {> 6 bpm), Accelerations: Reactive and Decelerations: Absent Uterine activity: irregular and Duration: unknown   Dilation:  1 Effacement (%): 80 Station: -3 Exam by:: Kris HartmannNicole Jones, RN   Prenatal labs: ABO, Rh: --/--/A NEG (07/31 1453) Antibody: NEG (07/31 1453) Rubella: 1.13 (01/16 1015) RPR: Non Reactive (05/21 1213)  HBsAg: Negative (01/16 1015)  HIV:    GBS: Negative (07/11 1512)  1 hr Glucola 124 Genetic screening  negative Anatomy US normal   Prenatal Transfer Tool  Maternal Diabetes: No Genetic Screening: Normal Maternal Ultrasounds/Referrals: Normal Fetal Ultrasounds or other Referrals:  None Maternal Substance Abuse:  No Significant Maternal Medications:  None Significant Maternal Lab Results: None  Results for orders placed or performed during the hospital encounter of 02/25/17 (from the past 24 hour(s))  CBC   Collection Time: 02/25/17  2:53 PM  Result Value Ref Range   WBC 8.8 4.0 - 10.5 K/uL   RBC 4.43 3.87 - 5.11 MIL/uL   Hemoglobin 12.5 12.0 - 15.0 g/dL   HCT 16.137.1 09.636.0 - 04.546.0 %   MCV 83.7 78.0 - 100.0 fL   MCH 28.2 26.0 - 34.0 pg   MCHC 33.7 30.0 - 36.0 g/dL   RDW 40.914.2 81.111.5 - 91.415.5 %   Platelets 124 (L) 150 - 400 K/uL  Protein / creatinine ratio, urine   Collection Time: 02/25/17  2:53 PM  Result Value Ref Range   Creatinine, Urine 140.00 mg/dL   Total Protein, Urine 9 mg/dL   Protein Creatinine Ratio 0.06 0.00 - 0.15 mg/mg[Cre]  Urinalysis, Complete w Microscopic   Collection Time: 02/25/17  2:53 PM  Result Value Ref Range   Color, Urine YELLOW YELLOW   APPearance HAZY (A) CLEAR   Specific Gravity, Urine 1.023 1.005 - 1.030   pH 5.0 5.0 - 8.0   Glucose, UA NEGATIVE NEGATIVE mg/dL   Hgb urine dipstick NEGATIVE NEGATIVE   Bilirubin Urine NEGATIVE NEGATIVE   Ketones, ur 5 (A) NEGATIVE mg/dL   Protein, ur NEGATIVE NEGATIVE mg/dL   Nitrite NEGATIVE NEGATIVE   Leukocytes, UA NEGATIVE NEGATIVE   RBC / HPF 0-5 0 - 5 RBC/hpf   WBC, UA 0-5 0 - 5 WBC/hpf   Bacteria, UA RARE (A) NONE SEEN   Squamous Epithelial / LPF 6-30 (A) NONE SEEN   Mucous PRESENT   Comprehensive  metabolic panel   Collection Time: 02/25/17  2:53 PM  Result Value  Ref Range   Sodium 136 135 - 145 mmol/L   Potassium 4.3 3.5 - 5.1 mmol/L   Chloride 107 101 - 111 mmol/L   CO2 20 (L) 22 - 32 mmol/L   Glucose, Bld 75 65 - 99 mg/dL   BUN 9 6 - 20 mg/dL   Creatinine, Ser 9.140.44 0.44 - 1.00 mg/dL   Calcium 9.3 8.9 - 78.210.3 mg/dL   Total Protein 6.8 6.5 - 8.1 g/dL   Albumin 3.2 (L) 3.5 - 5.0 g/dL   AST 23 15 - 41 U/L   ALT 11 (L) 14 - 54 U/L   Alkaline Phosphatase 119 38 - 126 U/L   Total Bilirubin 0.2 (L) 0.3 - 1.2 mg/dL   GFR calc non Af Amer >60 >60 mL/min   GFR calc Af Amer >60 >60 mL/min   Anion gap 9 5 - 15  Type and screen [facility]   Collection Time: 02/25/17  2:53 PM  Result Value Ref Range   ABO/RH(D) A NEG    Antibody Screen NEG    Sample Expiration 02/28/2017     Patient Active Problem List   Diagnosis Date Noted  . Gestational hypertension 02/25/2017  . Rh negative state in antepartum period 12/16/2016  . Seasonal allergic rhinitis due to pollen 11/18/2016  . Maternal morbid obesity, antepartum (HCC) 08/13/2016  . Supervision of normal pregnancy, antepartum 08/12/2016  . Gestational thrombocytopenia (HCC) 04/13/2012    Assessment/Plan:  Sylvia Chang is a 26 y.o. G2P1001 at 6564w5d here for IOL for gHTN.   #Labor: not in active labor yet; cytotec 25mcg  at 1545.  #Pain: Non at the moment; requesting Epidural  #FWB: Cat 1  #ID:  negative #MOF: breastfeeding #MOC:paragard #Circ:  no  Ignacia MarvelKendrick C White, MD  02/25/2017, 5:13 PM  I was present for the exam and agree with above.  Katrinka BlazingSmith, [place], [place] 02/25/2017 5:48 PM

## 2017-02-25 NOTE — Progress Notes (Signed)
Patient complains of increase in fluid retention- especially in hands and face. Some headaches in early morning.

## 2017-02-26 ENCOUNTER — Inpatient Hospital Stay (HOSPITAL_COMMUNITY): Payer: Medicaid Other | Admitting: Anesthesiology

## 2017-02-26 ENCOUNTER — Encounter (HOSPITAL_COMMUNITY): Payer: Self-pay | Admitting: Anesthesiology

## 2017-02-26 DIAGNOSIS — Z3A38 38 weeks gestation of pregnancy: Secondary | ICD-10-CM

## 2017-02-26 DIAGNOSIS — O134 Gestational [pregnancy-induced] hypertension without significant proteinuria, complicating childbirth: Secondary | ICD-10-CM

## 2017-02-26 LAB — CBC
HCT: 33.8 % — ABNORMAL LOW (ref 36.0–46.0)
HCT: 36.1 % (ref 36.0–46.0)
HEMOGLOBIN: 12 g/dL (ref 12.0–15.0)
Hemoglobin: 11.2 g/dL — ABNORMAL LOW (ref 12.0–15.0)
MCH: 28.1 pg (ref 26.0–34.0)
MCH: 28.2 pg (ref 26.0–34.0)
MCHC: 33.1 g/dL (ref 30.0–36.0)
MCHC: 33.2 g/dL (ref 30.0–36.0)
MCV: 84.7 fL (ref 78.0–100.0)
MCV: 84.9 fL (ref 78.0–100.0)
PLATELETS: 101 10*3/uL — AB (ref 150–400)
Platelets: 127 10*3/uL — ABNORMAL LOW (ref 150–400)
RBC: 3.98 MIL/uL (ref 3.87–5.11)
RBC: 4.26 MIL/uL (ref 3.87–5.11)
RDW: 14.4 % (ref 11.5–15.5)
RDW: 14.5 % (ref 11.5–15.5)
WBC: 12.5 10*3/uL — ABNORMAL HIGH (ref 4.0–10.5)
WBC: 9.7 10*3/uL (ref 4.0–10.5)

## 2017-02-26 LAB — RPR: RPR Ser Ql: NONREACTIVE

## 2017-02-26 MED ORDER — MISOPROSTOL 200 MCG PO TABS
ORAL_TABLET | ORAL | Status: AC
  Filled 2017-02-26: qty 4

## 2017-02-26 MED ORDER — DIPHENHYDRAMINE HCL 25 MG PO CAPS: 25.0000 mg | ORAL_CAPSULE | Freq: Four times a day (QID) | ORAL | Status: DC | PRN

## 2017-02-26 MED ORDER — PHENYLEPHRINE 40 MCG/ML (10ML) SYRINGE FOR IV PUSH (FOR BLOOD PRESSURE SUPPORT)
80.0000 ug | PREFILLED_SYRINGE | INTRAVENOUS | Status: DC | PRN
  Filled 2017-02-26: qty 5

## 2017-02-26 MED ORDER — LACTATED RINGERS IV SOLN
500.0000 mL | Freq: Once | INTRAVENOUS | Status: AC
  Administered 2017-02-26: 500 mL via INTRAVENOUS

## 2017-02-26 MED ORDER — EPHEDRINE 5 MG/ML INJ
10.0000 mg | INTRAVENOUS | Status: DC | PRN
  Filled 2017-02-26: qty 2

## 2017-02-26 MED ORDER — LABETALOL HCL 5 MG/ML IV SOLN: 20.0000 mg | INTRAVENOUS | Status: DC | PRN

## 2017-02-26 MED ORDER — ONDANSETRON HCL 4 MG PO TABS: 4.0000 mg | ORAL_TABLET | ORAL | Status: DC | PRN

## 2017-02-26 MED ORDER — BENZOCAINE-MENTHOL 20-0.5 % EX AERO: 1.0000 "application " | INHALATION_SPRAY | CUTANEOUS | Status: DC | PRN

## 2017-02-26 MED ORDER — SIMETHICONE 80 MG PO CHEW: 80.0000 mg | CHEWABLE_TABLET | ORAL | Status: DC | PRN

## 2017-02-26 MED ORDER — ONDANSETRON HCL 4 MG/2ML IJ SOLN: 4.0000 mg | INTRAMUSCULAR | Status: DC | PRN

## 2017-02-26 MED ORDER — DIPHENHYDRAMINE HCL 50 MG/ML IJ SOLN: 12.5000 mg | INTRAMUSCULAR | Status: DC | PRN

## 2017-02-26 MED ORDER — LIDOCAINE HCL (PF) 1 % IJ SOLN
INTRAMUSCULAR | Status: DC | PRN
  Administered 2017-02-26: 5 mL via EPIDURAL
  Administered 2017-02-26: 8 mL via EPIDURAL

## 2017-02-26 MED ORDER — SENNOSIDES-DOCUSATE SODIUM 8.6-50 MG PO TABS
2.0000 | ORAL_TABLET | ORAL | Status: DC
  Administered 2017-02-26: 2 via ORAL
  Filled 2017-02-26: qty 2

## 2017-02-26 MED ORDER — DIBUCAINE 1 % RE OINT: 1.0000 "application " | TOPICAL_OINTMENT | RECTAL | Status: DC | PRN

## 2017-02-26 MED ORDER — MISOPROSTOL 200 MCG PO TABS
800.0000 ug | ORAL_TABLET | Freq: Once | ORAL | Status: AC
  Administered 2017-02-26: 800 ug via ORAL

## 2017-02-26 MED ORDER — IBUPROFEN 600 MG PO TABS
600.0000 mg | ORAL_TABLET | Freq: Four times a day (QID) | ORAL | Status: DC
  Administered 2017-02-26 – 2017-02-27 (×4): 600 mg via ORAL
  Filled 2017-02-26 (×4): qty 1

## 2017-02-26 MED ORDER — ZOLPIDEM TARTRATE 5 MG PO TABS: 5.0000 mg | ORAL_TABLET | Freq: Every evening | ORAL | Status: DC | PRN

## 2017-02-26 MED ORDER — ACETAMINOPHEN 325 MG PO TABS
650.0000 mg | ORAL_TABLET | ORAL | Status: DC | PRN
  Administered 2017-02-26: 650 mg via ORAL
  Filled 2017-02-26: qty 2

## 2017-02-26 MED ORDER — FENTANYL 2.5 MCG/ML BUPIVACAINE 1/10 % EPIDURAL INFUSION (WH - ANES)
14.0000 mL/h | INTRAMUSCULAR | Status: DC | PRN
  Administered 2017-02-26 (×2): 14 mL/h via EPIDURAL
  Filled 2017-02-26 (×2): qty 100

## 2017-02-26 MED ORDER — PHENYLEPHRINE 40 MCG/ML (10ML) SYRINGE FOR IV PUSH (FOR BLOOD PRESSURE SUPPORT)
80.0000 ug | PREFILLED_SYRINGE | INTRAVENOUS | Status: DC | PRN
  Filled 2017-02-26: qty 5
  Filled 2017-02-26: qty 10

## 2017-02-26 MED ORDER — COCONUT OIL OIL: 1.0000 "application " | TOPICAL_OIL | Status: DC | PRN

## 2017-02-26 MED ORDER — WITCH HAZEL-GLYCERIN EX PADS: 1.0000 "application " | MEDICATED_PAD | CUTANEOUS | Status: DC | PRN

## 2017-02-26 MED ORDER — HYDRALAZINE HCL 20 MG/ML IJ SOLN: 10.0000 mg | Freq: Once | INTRAMUSCULAR | Status: DC | PRN

## 2017-02-26 MED ORDER — TETANUS-DIPHTH-ACELL PERTUSSIS 5-2.5-18.5 LF-MCG/0.5 IM SUSP: 0.5000 mL | Freq: Once | INTRAMUSCULAR | Status: DC

## 2017-02-26 MED ORDER — PRENATAL MULTIVITAMIN CH
1.0000 | ORAL_TABLET | Freq: Every day | ORAL | Status: DC
  Administered 2017-02-27: 1 via ORAL
  Filled 2017-02-26: qty 1

## 2017-02-26 NOTE — Progress Notes (Signed)
LABOR PROGRESS NOTE  Sylvia KeensVeronica D Chang is a 26 y.o. G2P1001 at 5273w6d  admitted for IOL for GHTN  Subjective: Doing well. Comfortable with epidural  Objective: BP 128/71   Pulse 85   Temp 98.4 F (36.9 C) (Oral)   Resp 18   Ht 5\' 3"  (1.6 m)   Wt (!) 354 lb (160.6 kg)   LMP 05/21/2016 (Exact Date)   SpO2 99%   BMI 62.71 kg/m  or  Vitals:   02/26/17 1301 02/26/17 1331 02/26/17 1401 02/26/17 1431  BP: 119/74 129/72 140/87 128/71  Pulse: 96 76 (!) 120 85  Resp: 16 18 16 18   Temp:      TempSrc:      SpO2:      Weight:      Height:        Dilation: 5.5 Effacement (%): 80 Cervical Position: Posterior Station: -2 Presentation: Vertex Exam by:: Sylvia Plateravis, RN  FHT: baseline rate 150, moderate varibility, +acel, no decel Toco: ctx q2-4 min  Assessment / Plan: 26 y.o. G2P1001 at 2673w6d [facility]. Has gestational thrombocytopenia (Plt 127), and h/o PPH.   Labor: AROM at 1510, clear fluid. Continue to titrate Pitocin, currently at 3218mu/m Fetal Wellbeing:  Cat I Pain Control:  Epidural in place Anticipated MOD:  SVD GHTN: BP wnl. Continue to monitor   Sylvia PearJulie P Nkosi Cortright, MD 02/26/2017, 3:11 PM

## 2017-02-26 NOTE — Anesthesia Procedure Notes (Signed)
Epidural Patient location during procedure: OB Start time: 02/26/2017 2:26 AM End time: 02/26/2017 2:30 AM  Staffing Anesthesiologist: Leilani AbleHATCHETT, Jailen Coward Performed: anesthesiologist   Preanesthetic Checklist Completed: patient identified, surgical consent, pre-op evaluation, timeout performed, IV checked, risks and benefits discussed and monitors and equipment checked  Epidural Patient position: sitting Prep: site prepped and draped and DuraPrep Patient monitoring: continuous pulse ox and blood pressure Approach: midline Location: L3-L4 Injection technique: LOR air  Needle:  Needle type: Tuohy  Needle gauge: 17 G Needle length: 9 cm and 9 Needle insertion depth: 9 cm Catheter type: closed end flexible Catheter size: 19 Gauge Catheter at skin depth: 14 cm Test dose: negative and Other  Assessment Sensory level: T9 Events: blood not aspirated, injection not painful, no injection resistance, negative IV test and no paresthesia  Additional Notes Reason for block:procedure for pain

## 2017-02-26 NOTE — Anesthesia Preprocedure Evaluation (Signed)
Anesthesia Evaluation  Patient identified by MRN, date of birth, ID band Patient awake    Reviewed: Allergy & Precautions, H&P , NPO status , Patient's Chart, lab work & pertinent test results  Airway Mallampati: IV  TM Distance: >3 FB Neck ROM: full    Dental no notable dental hx.    Pulmonary neg pulmonary ROS, asthma , former smoker,    Pulmonary exam normal breath sounds clear to auscultation       Cardiovascular hypertension, Normal cardiovascular exam Rhythm:regular Rate:Normal     Neuro/Psych negative neurological ROS  negative psych ROS   GI/Hepatic negative GI ROS, Neg liver ROS,   Endo/Other  negative endocrine ROSMorbid obesity  Renal/GU negative Renal ROS  negative genitourinary   Musculoskeletal   Abdominal (+) + obese,   Peds  Hematology negative hematology ROS (+)   Anesthesia Other Findings   Reproductive/Obstetrics (+) Pregnancy                             Anesthesia Physical  Anesthesia Plan  ASA: III  Anesthesia Plan: Epidural   Post-op Pain Management:    Induction:   PONV Risk Score and Plan:   Airway Management Planned:   Additional Equipment:   Intra-op Plan:   Post-operative Plan:   Informed Consent: I have reviewed the patients History and Physical, chart, labs and discussed the procedure including the risks, benefits and alternatives for the proposed anesthesia with the patient or authorized representative who has indicated his/her understanding and acceptance.     Plan Discussed with:   Anesthesia Plan Comments:         Anesthesia Quick Evaluation

## 2017-02-26 NOTE — Progress Notes (Signed)
Patient ID: Coralie KeensVeronica D Quiocho, female   DOB: 24-Jan-1991, 24 y.o.   MRN: [id number] Labor Progress Note  S: Patient examined for progress of labor. Patient comfortable with epidural.   O: BP 132/63   Pulse 68   Temp 98.8 F (37.1 C) (Oral)   Resp 18   Ht 5\' 3"  (1.6 m)   Wt (!) 160.6 kg (354 lb)   LMP 05/21/2016 (Exact Date)   SpO2 99%   BMI 62.71 kg/m   FHT: 130bpm, mod var, +accels, no decels TOCO: q2-54min, patient looks comfortable during contractions  CVE: Dilation: 4.5 Effacement (%): 50 Cervical Position: Posterior Station: Ballotable Presentation: Vertex Exam by:: N Psychologist, counsellingDeal RN  A&P: 26 y.o. G2P1001 547w6d for IOL for GHTN, gestational thrombocytopenia with h/o PPH with transfusion  Currently on 592milli-units of pitocin, s/p foley bulb at 2230, s/p cytotec x1 Continue to monitor pressures Continue induction management Anticipate SVD  [place] Lawerence Dery, DO FM Resident PGY-1 02/26/2017 5:28 AM

## 2017-02-26 NOTE — Progress Notes (Signed)
LABOR PROGRESS NOTE  Sylvia KeensVeronica D Chang is a [age] y.o. G2P1001 at 3547w6d  admitted for IOL for GHTN  Subjective: Doing well. Comfortable with epidural  Objective: BP 120/66   Pulse 64   Temp 98.8 F (37.1 C) (Oral)   Resp 18   Ht 5\' 3"  (1.6 m)   Wt (!) 354 lb (160.6 kg)   LMP 05/21/2016 (Exact Date)   SpO2 99%   BMI 62.71 kg/m  or  Vitals:   02/26/17 0700 02/26/17 0731 02/26/17 0740 02/26/17 0801  BP: 136/64 135/66  120/66  Pulse: 66 73  64  Resp: 18 18 18 18   Temp:      TempSrc:      SpO2:      Weight:      Height:        Dilation: 4.5 Effacement (%): 50 Cervical Position: Posterior Station: Ballotable Presentation: Vertex Exam by:: N Deal RN FHT: baseline rate 130, moderate varibility, +acel, no decel Toco: ctx q4-5 min  Assessment / Plan: 26 y.o. G2P1001 at 4447w6d [facility]. Has gestational thrombocytopenia (Plt 127), and h/o PPH.   Labor: s/p FB. Continue to titrate Pitocin.  Fetal Wellbeing:  Cat I Pain Control:  Epidural in place Anticipated MOD:  SVD GHTN: BP wnl. Continue to monitor   Frederik PearJulie P Arlyne Brandes, MD 02/26/2017, 9:52 AM

## 2017-02-26 NOTE — Progress Notes (Signed)
Patient ID: Coralie KeensVeronica D Degraff, female   DOB: 1991/03/27, 26 y.o.   MRN: [id number] Labor Progress Note  S: Patient examined for progress of labor. Patient comfortable, awaiting epidural.   O: BP 131/69   Pulse 72   Temp 98.6 F (37 C) (Oral)   Resp 18   Ht 5\' 3"  (1.6 m)   Wt (!) 160.6 kg (354 lb)   LMP 05/21/2016 (Exact Date)   BMI 62.71 kg/m   FHT: 150bpm, mod var, +accels, no decels TOCO: q2-384min, patient looks comfortable during contractions  CVE: Dilation: 4 Effacement (%): 50 Cervical Position: Posterior Station: -3 Presentation: Vertex Exam by:: N Psychologist, counsellingDeal RN  A&P: 26 y.o. G2P1001 6515w6d for IOL for GHTN, gestational thrombocytopenia with h/o PPH with transfusion  Currently on 722milli-units of pitocin, s/p foley bulb out at 2230 Continue induction management Anticipate SVD  [place] Ayaan Shutes, DO FM Resident PGY-1 02/26/2017 1:20 AM

## 2017-02-27 ENCOUNTER — Telehealth: Payer: Self-pay

## 2017-02-27 LAB — BIRTH TISSUE RECOVERY COLLECTION (PLACENTA DONATION)

## 2017-02-27 NOTE — Lactation Note (Signed)
This note was copied from a baby's chart. Lactation Consultation Note  Patient Name: Sylvia Chang Reason for consult: Initial assessment  Baby is 20 hours old , and as LC walked in the room mom  was changed a stool diaper  Baby awake, LC assisted mom to latch in the cross cradle position, depth achieved.  Swallows noted, increased with breast compressions. Still feeding at 10 mins  LC reviewed doc flow sheets ,  Per mom active with WIC / [place]     Maternal Data Does the patient have breastfeeding experience prior to this delivery?: Yes  Feeding Feeding Type: Breast Fed Length of feed:  (swallows noted, increased w breast compressions )  LATCH Score Latch: Grasps breast easily, tongue down, lips flanged, rhythmical sucking.  Audible Swallowing: Spontaneous and intermittent  Type of Nipple: Everted at rest and after stimulation  Comfort (Breast/Nipple): Soft / non-tender  Hold (Positioning): Assistance needed to correctly position infant at breast and maintain latch.  LATCH Score: 9  Interventions Interventions: Breast feeding basics reviewed;Assisted with latch;Skin to skin;Breast massage;Hand express;Adjust position;Breast compression;Support pillows;Position options;Expressed milk  Lactation Tools Discussed/Used WIC Program: Yes   Consult Status Consult Status: Follow-up Date: 02/28/17 Follow-up type: In-patient    Sylvia Chang Chang, 12:52 PM

## 2017-02-27 NOTE — Progress Notes (Signed)
POSTPARTUM PROGRESS NOTE  Post Partum Day 1  Subjective:  Coralie KeensVeronica D Kjos is a 26 y.o. [id number] 697w6d s/p svd.  No acute events overnight.  Pt denies problems with ambulating, voiding or po intake.  She denies nausea or vomiting.  Pain is well controlled.  She has had flatus. She has not had bowel movement.  Lochia Small.   Objective: Blood pressure (!) 115/46, pulse 83, temperature 98.6 F (37 C), temperature source Oral, resp. rate 16, height 5\' 3"  (1.6 m), weight (!) 160.6 kg (354 lb), last menstrual period 05/21/2016, SpO2 99 %, unknown if currently breastfeeding.  Physical Exam:  General: alert, cooperative and no distress Lochia:normal flow Chest: CTAB Heart: RRR no m/r/g Abdomen: +BS, soft, nontender,  Uterine Fundus: firm DVT Evaluation: No calf swelling or tenderness Extremities: no edema   Recent Labs  02/26/17 0023 02/26/17 1648  HGB 12.0 11.2*  HCT 36.1 33.8*    Assessment/Plan:  ASSESSMENT: Coralie KeensVeronica D Brayboy is a 26 y.o. [id number] 447w6d s/p svd  Plan for discharge tomorrow   LOS: 2 days   Sullivan LoneBrannon L Sharon Stapel, Medical Student

## 2017-02-27 NOTE — Anesthesia Postprocedure Evaluation (Signed)
Anesthesia Post Note  Patient: Sylvia KeensVeronica D Chang  Procedure(s) Performed: * No procedures listed *     Patient location during evaluation: Mother Baby Anesthesia Type: Epidural Level of consciousness: awake and alert Pain management: pain level controlled Vital Signs Assessment: post-procedure vital signs reviewed and stable Respiratory status: spontaneous breathing, nonlabored ventilation and respiratory function stable Cardiovascular status: stable Postop Assessment: no headache, no backache and epidural receding Anesthetic complications: no    Last Vitals:  Vitals:   02/26/17 2338 02/27/17 0708  BP: (!) 116/44 (!) 115/46  Pulse: 96 83  Resp: 16 16  Temp: 37 C 37 C    Last Pain:  Vitals:   02/27/17 0708  TempSrc: Oral  PainSc: 0-No pain   Pain Goal: Patients Stated Pain Goal: 9 (Thinking about an epidural) (02/25/17 2358)               Marrion CoyMERRITT,[name]

## 2017-02-27 NOTE — Telephone Encounter (Signed)
Spoke with [employer] regarding documents needed. Angie states that she needs documentation as to why pt needed to be out of work prior to delivery date. OV notes sent from 7/19 and 7/31. I informed Angie that pt delivered 8/1.

## 2017-02-27 NOTE — Discharge Summary (Signed)
OB Discharge Summary     Patient Name: Sylvia Chang DOB: 1991/06/15 MRN: [id number]  Date of admission: 02/25/2017 Delivering MD: Frederik PearEGELE, JULIE P   Date of discharge: 02/27/2017  Admitting diagnosis: INDUCTION Intrauterine pregnancy: 3082w6d     Secondary diagnosis:  Active Problems:   Gestational hypertension  Additional problems:  Patient Active Problem List   Diagnosis Date Noted  . Gestational hypertension 02/25/2017  . Rh negative state in antepartum period 12/16/2016  . Seasonal allergic rhinitis due to pollen 11/18/2016  . Maternal morbid obesity, antepartum (HCC) 08/13/2016  . Supervision of normal pregnancy, antepartum 08/12/2016  . Gestational thrombocytopenia (HCC) 04/13/2012   Discharge diagnosis: Term Pregnancy Delivered                                                                                                Post partum procedures:None  Augmentation: AROM, Pitocin, Cytotec and Foley Balloon  Complications: None  Hospital course:  Induction of Labor With Vaginal Delivery   26 y.o. yo [id number] at 5582w6d was admitted to the hospital 02/25/2017 for induction of labor.  Indication for induction: Gestational hypertension.  Patient had an uncomplicated labor course as follows: Membrane Rupture Time/Date: 3:09 PM ,02/26/2017   Intrapartum Procedures: Episiotomy: None [1]                                         Lacerations:  1st degree [2]  Patient had delivery of a Viable infant.  Information for the patient's newborn:  Olene Flossspitia, Boy Ariel [[id number]][[id number]]  Delivery Method: Vaginal, Spontaneous Delivery (Filed from Delivery Summary)   02/26/2017  Details of delivery can be found in separate delivery note.  Patient had a routine postpartum course. Patient is discharged home 02/27/17.  Physical exam  Vitals:   02/26/17 1731 02/26/17 1825 02/26/17 2338 02/27/17 0708  BP: (!) 151/74 (!) 124/53 (!) 116/44 (!) 115/46  Pulse: 86 66 96 83  Resp: 18 16 16 16   Temp:   98.7 F (37.1 C) 98.6 F (37 C) 98.6 F (37 C)  TempSrc:  Oral Oral Oral  SpO2:      Weight:      Height:       General: alert, cooperative and no distress Lochia: appropriate Uterine Fundus: firm DVT Evaluation: No evidence of DVT seen on physical exam. Labs: Lab Results  Component Value Date   WBC 12.5 (H) 02/26/2017   HGB 11.2 (L) 02/26/2017   HCT 33.8 (L) 02/26/2017   MCV 84.9 02/26/2017   PLT 101 (L) 02/26/2017   CMP Latest Ref Rng & Units 02/25/2017  Glucose 65 - 99 mg/dL 75  BUN 6 - 20 mg/dL 9  Creatinine 1.300.44 - 8.651.00 mg/dL 7.840.44  Sodium 696135 - 295145 mmol/L 136  Potassium 3.5 - 5.1 mmol/L 4.3  Chloride 101 - 111 mmol/L 107  CO2 22 - 32 mmol/L 20(L)  Calcium 8.9 - 10.3 mg/dL 9.3  Total Protein 6.5 - 8.1 g/dL 6.8  Total Bilirubin 0.3 - 1.2 mg/dL  0.2(L)  Alkaline Phos 38 - 126 U/L 119  AST 15 - 41 U/L 23  ALT 14 - 54 U/L 11(L)    Discharge instruction: per After Visit Summary and "Baby and Me Booklet".  After visit meds:  Allergies as of 02/27/2017   No Known Allergies     Medication List    STOP taking these medications   docusate sodium 100 MG capsule Commonly known as:  COLACE   famotidine 40 MG tablet Commonly known as:  PEPCID   loratadine 10 MG tablet Commonly known as:  CLARITIN   Prenatal Vitamins 0.8 MG tablet       Diet: routine diet  Activity: Advance as tolerated. Pelvic rest for 6 weeks.   Outpatient follow up:6 weeks Follow up Appt:Future Appointments Date Time Provider Department Center  03/27/2017 4:00 PM Constant, Peggy, MD [facility] None   Follow up Visit:No Follow-up on file.  Postpartum contraception: IUD  Newborn Data: Live born female  Birth Weight: 7 lb 12.9 oz (3541 g) APGAR: 9, 9  Baby Feeding: Breast Disposition:home with mother  Caryl AdaJazma Phelps, DO OB Fellow Faculty Practice, [facility] 02/27/2017, 8:39 PM

## 2017-03-06 ENCOUNTER — Encounter: Payer: Medicaid Other | Admitting: Obstetrics

## 2017-03-27 ENCOUNTER — Encounter: Payer: Self-pay | Admitting: Obstetrics and Gynecology

## 2017-03-27 ENCOUNTER — Ambulatory Visit (INDEPENDENT_AMBULATORY_CARE_PROVIDER_SITE_OTHER): Payer: Medicaid Other | Admitting: Obstetrics and Gynecology

## 2017-03-27 DIAGNOSIS — Z3202 Encounter for pregnancy test, result negative: Secondary | ICD-10-CM

## 2017-03-27 DIAGNOSIS — Z30430 Encounter for insertion of intrauterine contraceptive device: Secondary | ICD-10-CM | POA: Diagnosis not present

## 2017-03-27 DIAGNOSIS — Z1389 Encounter for screening for other disorder: Secondary | ICD-10-CM | POA: Diagnosis not present

## 2017-03-27 LAB — POCT URINE PREGNANCY: Preg Test, Ur: NEGATIVE

## 2017-03-27 MED ORDER — PARAGARD INTRAUTERINE COPPER IU IUD
INTRAUTERINE_SYSTEM | Freq: Once | INTRAUTERINE | Status: AC
  Administered 2017-03-27: 15:00:00 via INTRAUTERINE

## 2017-03-27 NOTE — Patient Instructions (Signed)

## 2017-03-27 NOTE — Progress Notes (Signed)
Subjective:     Sylvia Chang is a 26 y.o. female who presents for a postpartum visit. She is 4 weeks postpartum following a spontaneous vaginal delivery. I have fully reviewed the prenatal and intrapartum course. The delivery was at 38.4  gestational weeks secondary to IOL. Outcome: spontaneous vaginal delivery. Anesthesia: epidural. Postpartum course has been uncomplicated. Baby's course has been unremarkable. Baby is feeding by both breast and bottle - Similac Advance. Bleeding streaks of brown.. Bowel function is normal. Bladder function is normal. Patient is not sexually active. Contraception method is none. Postpartum depression screening: negative. Patient lost her mother in 2012 and admits to over worrying and waking up often to check on her baby. She receives help from her husband and her cousin     Review of Systems Pertinent items are noted in HPI.   Objective:    BP 117/79   Pulse 80   Ht 5\' 3"  (1.6 m)   Wt (!) 325 lb (147.4 kg)   Breastfeeding? Yes   BMI 57.57 kg/m   General:  alert, cooperative and no distress   Breasts:  inspection negative, no nipple discharge or bleeding, no masses or nodularity palpable  Lungs: clear to auscultation bilaterally  Heart:  regular rate and rhythm  Abdomen: soft, non-tender; bowel sounds normal; no masses,  no organomegaly   Vulva:  normal  Vagina: normal vagina, no discharge, exudate, lesion, or erythema  Cervix:  multiparous appearance  Corpus: normal size, contour, position, consistency, mobility, non-tender  Adnexa:  normal adnexa and no mass, fullness, tenderness  Rectal Exam: Not performed.        Assessment:     Normal postpartum exam. Pap smear not done at today's visit.   Plan:    1. Contraception: IUD  IUD Procedure Note Patient identified, informed consent performed, signed copy in chart, time out was performed.  Urine pregnancy test negative.  Speculum placed in the vagina.  Cervix visualized.  Cleaned with  Betadine x 2.  Grasped anteriorly with a single tooth tenaculum.  Uterus sounded to 8 cm.  Paraguard IUD placed per manufacturer's recommendations.  Strings trimmed to 3 cm. Tenaculum was removed, good hemostasis noted.  Patient tolerated procedure well.   Patient given post procedure instructions and Paraguard care card with expiration date.  Patient is asked to check IUD strings periodically and follow up in 4-6 weeks for IUD check.  2. Patient is medically cleared to resume all activities of daily living 3. Follow up in: 4 weeks for IUD check and  6 months  for annual exam or as needed.

## 2017-03-28 ENCOUNTER — Encounter: Payer: Self-pay | Admitting: *Deleted

## 2017-04-03 ENCOUNTER — Encounter: Payer: Self-pay | Admitting: *Deleted

## 2017-04-03 ENCOUNTER — Encounter: Payer: Self-pay | Admitting: Obstetrics and Gynecology

## 2017-04-03 ENCOUNTER — Ambulatory Visit (INDEPENDENT_AMBULATORY_CARE_PROVIDER_SITE_OTHER): Payer: Medicaid Other | Admitting: Obstetrics and Gynecology

## 2017-04-03 DIAGNOSIS — Z30431 Encounter for routine checking of intrauterine contraceptive device: Secondary | ICD-10-CM

## 2017-04-03 DIAGNOSIS — Z975 Presence of (intrauterine) contraceptive device: Secondary | ICD-10-CM | POA: Insufficient documentation

## 2017-04-03 NOTE — Progress Notes (Signed)
Patient ID: Sylvia Chang, female   DOB: Mar 10, 1991, 26 y.o.   MRN: [id number] Pt had ParaGard placed on 8/30. Had an episode of heavy bleeding and cramps after insertion. This has resolved. Feels like strings are to long as well.  PE AF VSS Morbid obese female in NAD  GU: Nl EGBUS, cervix no lesion, no bleeding noted, strings noted, one string is longer than other one, trimmed, no evidence of expulsion or malposition by exam today  A/P IUD check  Pt reassured about her concerns and these Sx are common after IUD insertion. Stringed trimmed today.  Will keep F/U appt for IUD check in 4 weeks. Hopefully Sx will have resolved.

## 2017-04-03 NOTE — Patient Instructions (Signed)

## 2017-04-03 NOTE — Progress Notes (Signed)
Pt c/o she can feel IUD while walking and it hurts, she feels the IUD coming out while voiding, and the strings are long. Pt wishes to switch IUD to Mirena to assist better with cycles.

## 2017-04-24 ENCOUNTER — Ambulatory Visit: Payer: Self-pay | Admitting: Obstetrics & Gynecology

## 2018-01-29 IMAGING — US US MFM OB DETAIL+14 WK
1 series · 14 of 28 positions shown · non-contrast
Comparison: none

[Series 1: us mfm ob detail+14 wk · 101 acquisitions, 14 frames shown]
[im 4/101]
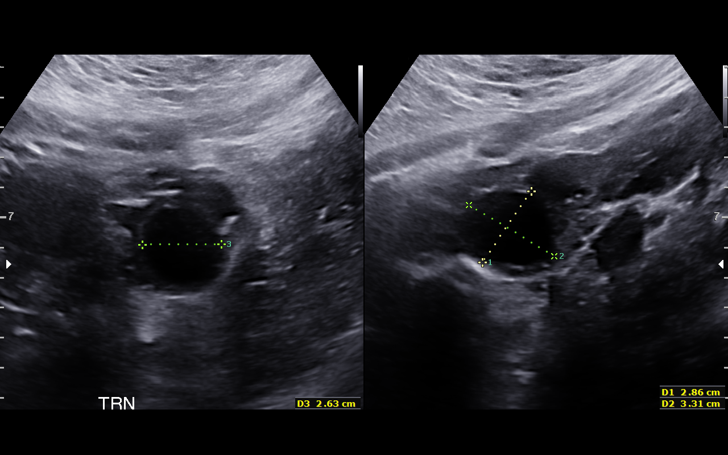
[im 12/101]
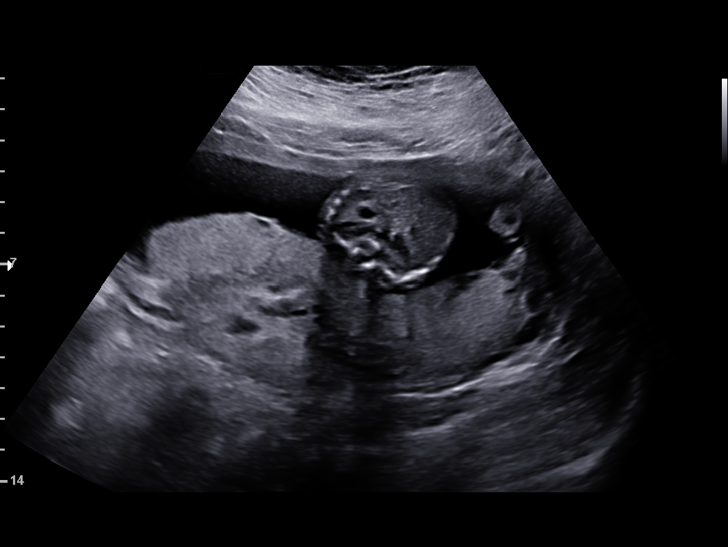
[im 19/101]
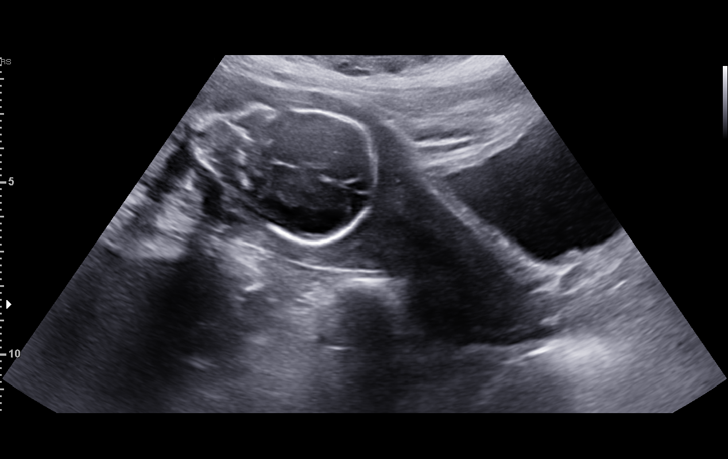
[im 26/101]
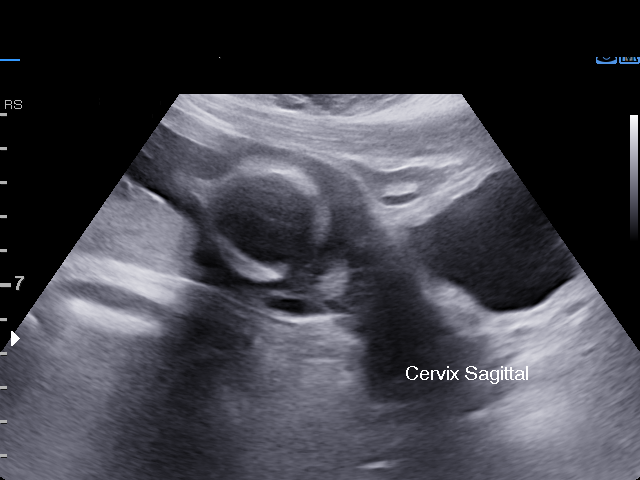
[im 34/101]
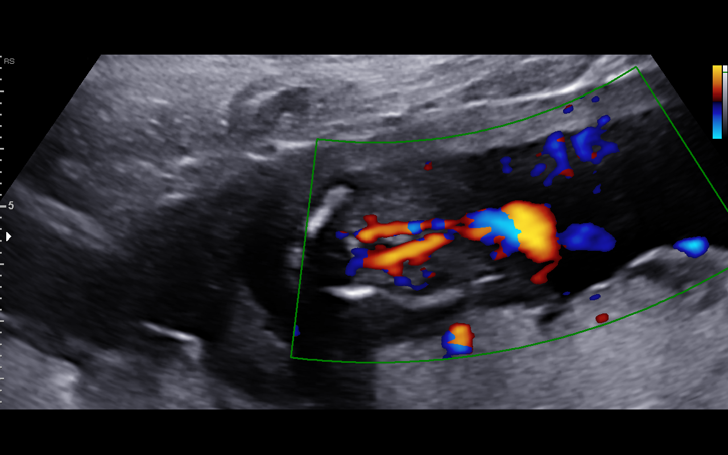
[im 41/101]
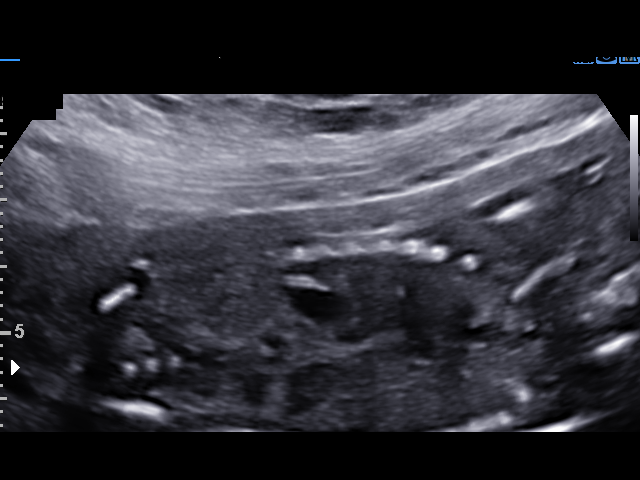
[im 49/101]
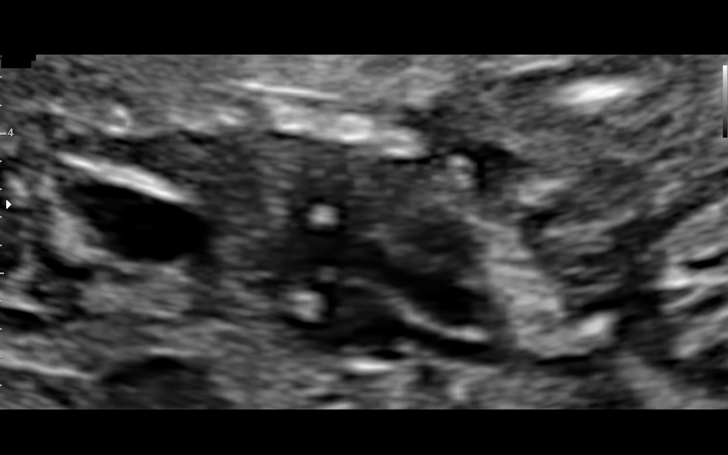
[im 56/101]
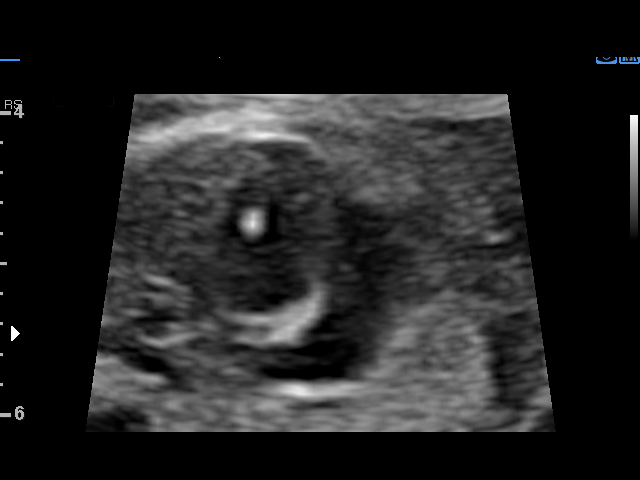
[im 63/101]
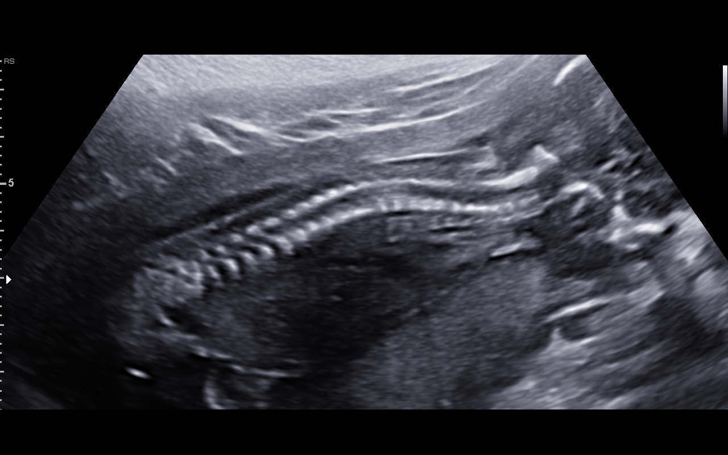
[im 71/101]
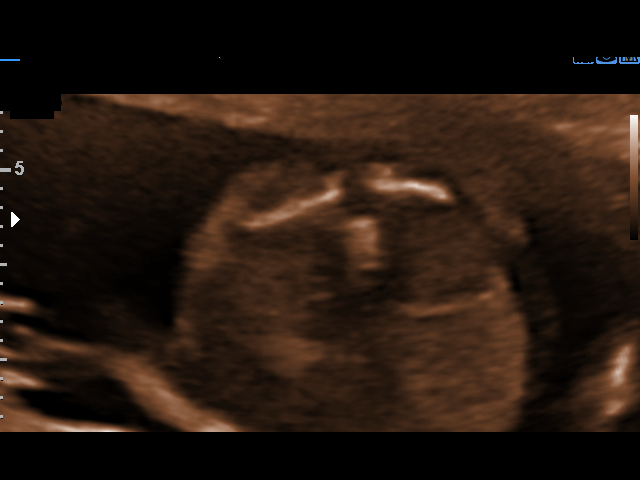
[im 78/101]
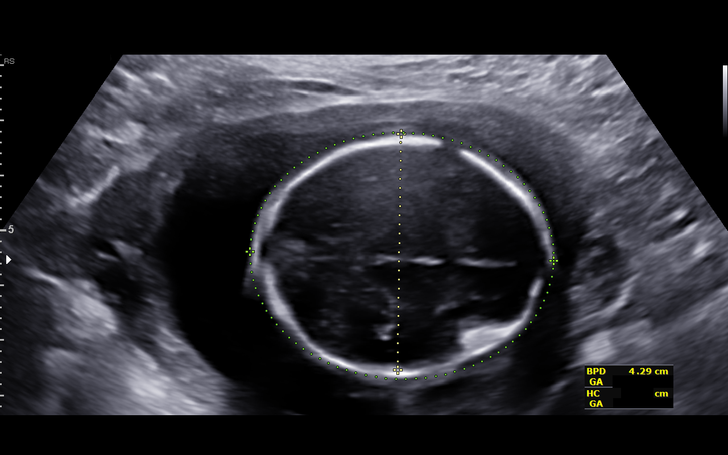
[im 86/101]
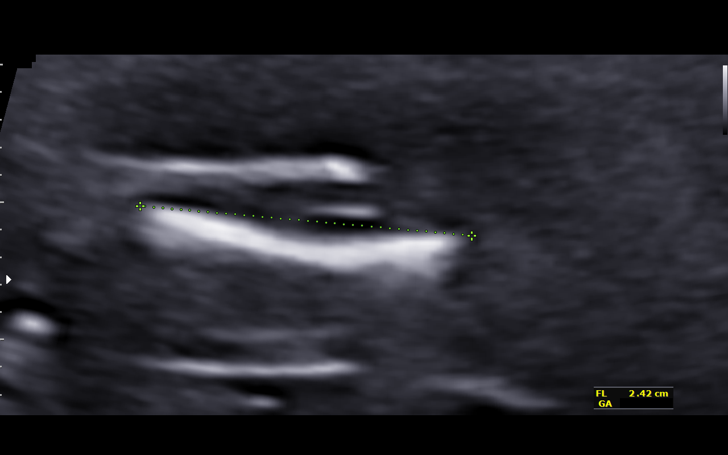
[im 93/101]
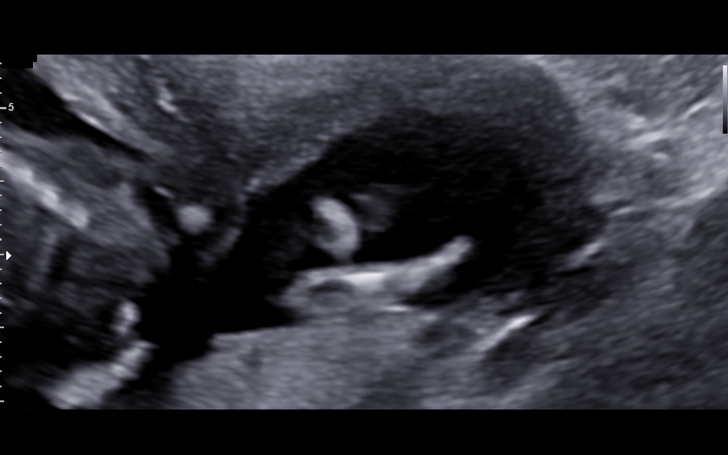
[im 101/101]
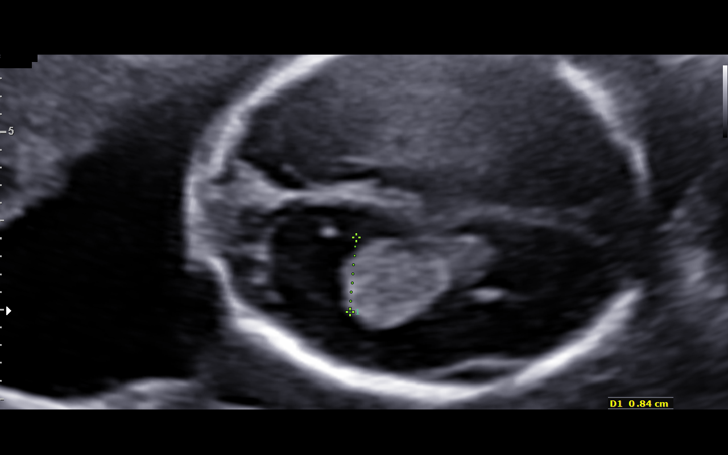

[14 of 28 positions shown; findings below may reference images not displayed]

1  ZAYD ROOF           [id number]      [id number]     [id number]
Indications

18 weeks gestation of pregnancy
Encounter for antenatal screening for
malformations
Maternal morbid obesity
Poor obstetrical history (gestational
thrombocytopenia prior preg)
OB History

Blood Type:            Height:         Weight (lb):  330.2    BMI:
Gravidity:    2         Term:   1        Prem:   0        SAB:   0
TOP:          0       Ectopic:  0        Living: 1
Fetal Evaluation

Num Of Fetuses:     1
Fetal Heart         155
Rate(bpm):
Cardiac Activity:   Observed
Presentation:       Cephalic
Placenta:           Posterior, above cervical os
P. Cord Insertion:  Visualized

Amniotic Fluid
AFI FV:      Subjectively within normal limits

Largest Pocket(cm)
4.68
Biometry
BPD:        43  mm     G. Age:  19w 0d         88  %    CI:        73.15   %   70 - 86
FL/HC:      15.4   %   15.8 - 18
HC:      159.8  mm     G. Age:  18w 6d         80  %    HC/AC:      1.26       1.07 -
AC:      126.5  mm     G. Age:  18w 2d         56  %    FL/BPD:     57.2   %
FL:       24.6  mm     G. Age:  17w 3d         24  %    FL/AC:      19.4   %   20 - 24
HUM:      25.3  mm     G. Age:  18w 0d         53  %
CER:      18.9  mm     G. Age:  18w 3d         64  %
NFT:       6.1  mm
CM:        5.7  mm

Est. FW:     218  gm      0 lb 8 oz     49  %
Gestational Age

LMP:           19w 2d       Date:   05/21/16                 EDD:   02/25/17
U/S Today:     18w 3d                                        EDD:   03/03/17
Anatomy

Cranium:               Appears normal         Aortic Arch:            Appears normal
Cavum:                 Appears normal         Ductal Arch:            Not well visualized
Ventricles:            Appears normal         Diaphragm:              Appears normal
Choroid Plexus:        Appears normal         Stomach:                Appears normal, left
sided
Cerebellum:            Appears normal         Abdomen:                Appears normal
Posterior Fossa:       Appears normal         Abdominal Wall:         Appears nml (cord
insert, abd wall)
Nuchal Fold:           Appears normal         Cord Vessels:           Appears normal (3
vessel cord)
Face:                  Orbits nl; profile not Kidneys:                Appear normal
well visualized
Lips:                  Not well visualized    Bladder:                Appears normal
Thoracic:              Appears normal         Spine:                  Appears normal
Heart:                 Echogenic focus        Upper Extremities:      Appears normal
in LV
RVOT:                  Not well visualized    Lower Extremities:      Appears normal
LVOT:                  Appears normal

Other:  Fetus appears to be a male. Heels visualized. Technically difficult due
to maternal habitus and fetal position.
Cervix Uterus Adnexa

Cervix
Length:           3.59  cm.
Normal appearance by transabdominal scan.

Uterus
No abnormality visualized.

Left Ovary
Within normal limits. Simple cyst seen

Right Ovary
Within normal limits.

Adnexa:       No abnormality visualized. No adnexal mass
visualized.
Comments

An echogenic focus was seen in the left cardiac ventricle.
However, no gross fetal anomalies or other soft markers of
aneuploidy were identified.   An echogenic intracardiac focus
is felt to represent a calcified papillary muscle, and is not
associated with structural or functional cardiac abnormalities.
Although an echogenic cardiac focus may be associated with
an increased risk of Down syndrome, this risk is felt to be
minimal, especially when it is seen as an isolated finding.
Impression

Single IUP at 18w 0d
An echogenic intracardiac focus was noted in the left
ventricle (see comments)
Somewhat limited views of the fetal heart and lips obtained
The remainder of the fetal anatomy appears normal
Ultrasound measurements are consistent with early
ultrasound
Posterior placenta without previa
Normal amniotic fluid volume
Recommendations

Recommend follow-up ultrasound examination in 4 weeks to
complete anatomy

## 2022-05-11 ENCOUNTER — Ambulatory Visit: Payer: Self-pay

## 2023-08-04 ENCOUNTER — Emergency Department (HOSPITAL_BASED_OUTPATIENT_CLINIC_OR_DEPARTMENT_OTHER)
Admission: EM | Admit: 2023-08-04 | Discharge: 2023-08-04 | Discharge status: Against Medical Advice | Payer: Self-pay | Attending: Emergency Medicine | Admitting: Emergency Medicine

## 2023-08-04 ENCOUNTER — Encounter (HOSPITAL_BASED_OUTPATIENT_CLINIC_OR_DEPARTMENT_OTHER): Payer: Self-pay | Admitting: Emergency Medicine

## 2023-08-04 DIAGNOSIS — N939 Abnormal uterine and vaginal bleeding, unspecified: Secondary | ICD-10-CM | POA: Insufficient documentation

## 2023-08-04 DIAGNOSIS — Z5321 Procedure and treatment not carried out due to patient leaving prior to being seen by health care provider: Secondary | ICD-10-CM | POA: Insufficient documentation

## 2023-08-04 LAB — CBC
HCT: 34.6 % — ABNORMAL LOW (ref 36.0–46.0)
Hemoglobin: 11.2 g/dL — ABNORMAL LOW (ref 12.0–15.0)
MCH: 27.3 pg (ref 26.0–34.0)
MCHC: 32.4 g/dL (ref 30.0–36.0)
MCV: 84.4 fL (ref 80.0–100.0)
Platelets: 193 10*3/uL (ref 150–400)
RBC: 4.1 MIL/uL (ref 3.87–5.11)
RDW: 13.1 % (ref 11.5–15.5)
WBC: 7.2 10*3/uL (ref 4.0–10.5)
nRBC: 0 % (ref 0.0–0.2)

## 2023-08-04 LAB — HCG, QUANTITATIVE, PREGNANCY: hCG, Beta Chain, Quant, S: <1 m[IU]/mL (ref <5)

## 2023-08-04 NOTE — ED Triage Notes (Signed)
 Pt endorses vaginal bleeding x 2 months, increased bleeding last night. Pt reports using 1 tampon/hour

## 2023-09-14 ENCOUNTER — Ambulatory Visit
Admission: EM | Admit: 2023-09-14 | Discharge: 2023-09-14 | Disposition: A | Discharge status: Home / Self Care | Payer: Medicaid Other | Attending: Family Medicine | Admitting: Family Medicine

## 2023-09-14 DIAGNOSIS — J4520 Mild intermittent asthma, uncomplicated: Secondary | ICD-10-CM

## 2023-09-14 LAB — POC COVID19/FLU A&B COMBO
Covid Antigen, POC: NEGATIVE
Influenza A Antigen, POC: NEGATIVE
Influenza B Antigen, POC: NEGATIVE

## 2023-09-14 LAB — POCT RAPID STREP A (OFFICE): Rapid Strep A Screen: NEGATIVE

## 2023-09-14 MED ORDER — PREDNISONE 20 MG PO TABS: ORAL_TABLET | ORAL | 0 refills | Status: DC

## 2023-09-14 MED ORDER — PROMETHAZINE-DM 6.25-15 MG/5ML PO SYRP: 5.0000 mL | ORAL_SOLUTION | Freq: Three times a day (TID) | ORAL | 0 refills | Status: AC | PRN

## 2023-09-14 MED ORDER — ALBUTEROL SULFATE HFA 108 (90 BASE) MCG/ACT IN AERS: 1.0000 | INHALATION_SPRAY | Freq: Four times a day (QID) | RESPIRATORY_TRACT | 0 refills | Status: AC | PRN

## 2023-09-14 NOTE — Discharge Instructions (Addendum)
Start prednisone and albuterol for your asthmatic bronchitis. For sore throat or cough try using a honey-based tea. Use 3 teaspoons of honey with juice squeezed from half lemon. Place shaved pieces of ginger into 1/2-1 cup of water and warm over stove top. Then mix the ingredients and repeat every 4 hours as needed. Please take Tylenol 500mg -650mg  once every 6 hours for fevers, aches and pains. Hydrate very well with at least 2 liters (64 ounces) of water. Eat light meals such as soups (chicken and noodles, chicken wild rice, vegetable).  Do not eat any foods that you are allergic to.  Start an antihistamine like Zyrtec (10mg  daily) for postnasal drainage, sinus congestion.  You can take this together with pseudoephedrine (Sudafed) at a dose of 60 mg 3 times a day or twice daily as needed for the same kind of congestion.  Use cough syrup as needed.

## 2023-09-14 NOTE — ED Triage Notes (Signed)
Pt c/o cough, sore throat, head/chest congestion, right earache x 4 days-taking mucinex, dayquil and advil-NAD-steady gait

## 2023-09-14 NOTE — ED Provider Notes (Signed)
[facility]  Note:  This document was prepared using Conservation officer, historic buildings and may include unintentional dictation errors.  MRN: [id number] DOB: [birth year]  Subjective:   Sylvia Chang is a 33 y.o. female presenting for 4-day history of acute onset persistent coughing, throat pain, throat drainage, sinus and chest congestion, right ear pain.  Has a history of bronchitis, asthma, allergic rhinitis.  Patient is not currently breast-feeding.  No current facility-administered medications for this encounter.  Current Outpatient Medications:    PARAGARD INTRAUTERINE COPPER IU, by Intrauterine route., Disp: , Rfl:    Prenatal Vit-DSS-Fe Fum-FA (SE-NATAL 19) 29-1 MG TABS, TK 1 T PO  D, Disp: , Rfl: 12   No Known Allergies  Past Medical History:  Diagnosis Date   Asthma    Bronchitis    Ovarian cyst    Thrombocytopenia complicating pregnancy (HCC)      Past Surgical History:  Procedure Laterality Date   APPENDECTOMY     SINUS SURGERY WITH INSTATRAK     TONSILLECTOMY AND ADENOIDECTOMY     TUBES IN EARS      Family History  Problem Relation Age of Onset   Heart disease Mother        died at 27   Hypertension Mother        died at 23   Diabetes Father    Diabetes Paternal Uncle    Diabetes Paternal Grandfather     Social History   Tobacco Use   Smoking status: Former    Current packs/day: 0.00    Types: Cigarettes    Quit date: 09/20/2015    Years since quitting: 7.9   Smokeless tobacco: Never  Vaping Use   Vaping status: Never Used  Substance Use Topics   Alcohol use: No   Drug use: Not Currently    Types: Marijuana    ROS   Objective:   Vitals: BP (!) 130/53 (BP Location: Right Arm)   Pulse 95   Temp 99.3 F (37.4 C) (Oral)   Resp 20   LMP 09/09/2023   SpO2 97%   Physical Exam Constitutional:      General: She is not in acute distress.    Appearance: Normal appearance. She is well-developed and normal  weight. She is not ill-appearing, toxic-appearing or diaphoretic.  HENT:     Head: Normocephalic and atraumatic.     Right Ear: Tympanic membrane, ear canal and external ear normal. No drainage or tenderness. No middle ear effusion. There is no impacted cerumen. Tympanic membrane is not erythematous or bulging.     Left Ear: Tympanic membrane, ear canal and external ear normal. No drainage or tenderness.  No middle ear effusion. There is no impacted cerumen. Tympanic membrane is not erythematous or bulging.     Nose: Congestion present. No rhinorrhea.     Mouth/Throat:     Mouth: Mucous membranes are moist. No oral lesions.     Pharynx: No pharyngeal swelling, oropharyngeal exudate, posterior oropharyngeal erythema or uvula swelling.     Tonsils: No tonsillar exudate or tonsillar abscesses.  Eyes:     General: No scleral icterus.       Right eye: No discharge.        Left eye: No discharge.     Extraocular Movements: Extraocular movements intact.     Right eye: Normal extraocular motion.     Left eye: Normal extraocular motion.     Conjunctiva/sclera: Conjunctivae normal.  Cardiovascular:  Rate and Rhythm: Normal rate and regular rhythm.     Heart sounds: Normal heart sounds. No murmur heard.    No friction rub. No gallop.  Pulmonary:     Effort: Pulmonary effort is normal. No respiratory distress.     Breath sounds: No stridor. Wheezing and rhonchi present. No rales.     Comments: Mild wheezing and rhonchi in upper to mid lung fields. Chest:     Chest wall: No tenderness.  Musculoskeletal:     Cervical back: Normal range of motion and neck supple.  Lymphadenopathy:     Cervical: No cervical adenopathy.  Skin:    General: Skin is warm and dry.  Neurological:     General: No focal deficit present.     Mental Status: She is alert and oriented to person, place, and time.  Psychiatric:        Mood and Affect: Mood normal.        Behavior: Behavior normal.     Results for  orders placed or performed during the hospital encounter of 09/14/23 (from the past 24 hours)  POCT rapid strep A     Status: None   Collection Time: 09/14/23  1:53 PM  Result Value Ref Range   Rapid Strep A Screen Negative Negative  POC Covid + Flu A/B Antigen     Status: None   Collection Time: 09/14/23  2:02 PM  Result Value Ref Range   Influenza A Antigen, POC Negative Negative   Influenza B Antigen, POC Negative Negative   Covid Antigen, POC Negative Negative    Assessment and Plan :   PDMP not reviewed this encounter.  1. Mild intermittent asthmatic bronchitis without complication    Recommended starting albuterol, prednisone and supportive care for an asthmatic bronchitis.  There is likely a viral etiology and as such we will defer antibiotic use for now.  Counseled patient on potential for adverse effects with medications prescribed/recommended today, ER and return-to-clinic precautions discussed, patient verbalized understanding.    Wallis Bamberg, [place] 09/14/23 1456

## 2023-09-20 ENCOUNTER — Ambulatory Visit
Admission: EM | Admit: 2023-09-20 | Discharge: 2023-09-20 | Disposition: A | Discharge status: Home / Self Care | Payer: Self-pay | Attending: Family Medicine | Admitting: Family Medicine

## 2023-09-20 DIAGNOSIS — J209 Acute bronchitis, unspecified: Secondary | ICD-10-CM

## 2023-09-20 DIAGNOSIS — J4521 Mild intermittent asthma with (acute) exacerbation: Secondary | ICD-10-CM

## 2023-09-20 MED ORDER — AZITHROMYCIN 250 MG PO TABS: 250.0000 mg | ORAL_TABLET | Freq: Every day | ORAL | 0 refills | Status: AC

## 2023-09-20 MED ORDER — PREDNISONE 20 MG PO TABS: 40.0000 mg | ORAL_TABLET | Freq: Every day | ORAL | 0 refills | Status: AC

## 2023-09-20 NOTE — ED Triage Notes (Signed)
 Pt presents to UC for c/o c/o worsened chest congestion since last visit here at UC x1 week.  Pt has taken all medications as prescribed. Has also taken mucinex. Reports she feels easily out of breath.

## 2023-09-20 NOTE — ED Provider Notes (Signed)
 [facility]    CSN: [id number] Arrival date & time: 09/20/23  1305      History   Chief Complaint No chief complaint on file.   HPI Sylvia Chang is a 33 y.o. female presents for cough and asthma symptoms.  Patient was seen in urgent care on 2/16 for 4 days of cough/asthma.  She had a negative rapid strep flu and COVID testing.  She was treated for asthma exacerbation with albuterol inhaler, prednisone, and Promethazine DM.  Patient reports she completed this as prescribed but still has some chest congestion and shortness of breath.  She is continue to use the inhaler with temporary improvement.  Is any fevers over the past 72 hours.  States her ear pain and sore throat is also improved.  No nausea/vomiting/diarrhea, blood.  Has been taking Mucinex OTC as well.  No other concerns at this time.  HPI  Past Medical History:  Diagnosis Date   Asthma    Bronchitis    Ovarian cyst    Thrombocytopenia complicating pregnancy [facility])     Patient Active Problem List   Diagnosis Date Noted   Morbid obesity (HCC) 04/03/2017   IUD (intrauterine device) in place 04/03/2017   Seasonal allergic rhinitis due to pollen 11/18/2016    Past Surgical History:  Procedure Laterality Date   APPENDECTOMY     SINUS SURGERY WITH INSTATRAK     TONSILLECTOMY AND ADENOIDECTOMY     TUBES IN EARS      OB History     Gravida  2   Para  2   Term  2   Preterm      AB      Living  2      SAB      IAB      Ectopic      Multiple  0   Live Births  2            Home Medications    Prior to Admission medications   Medication Sig Start Date End Date Taking? Authorizing Provider  azithromycin (ZITHROMAX) 250 MG tablet Take 1 tablet (250 mg total) by mouth daily. Take first 2 tablets together, then 1 every day until finished. 09/20/23  Yes Radford Pax, NP  predniSONE (DELTASONE) 20 MG tablet Take 2 tablets (40 mg total) by mouth daily with breakfast for 5 days. 09/20/23  09/25/23 Yes Radford Pax, NP  albuterol (VENTOLIN HFA) 108 (90 Base) MCG/ACT inhaler Inhale 1-2 puffs into the lungs every 6 (six) hours as needed for wheezing or shortness of breath. 09/14/23   Wallis Bamberg, PA-C  PARAGARD INTRAUTERINE COPPER IU by Intrauterine route.    [provider]  Prenatal Vit-DSS-Fe Fum-FA (SE-NATAL 19) 29-1 MG TABS TK 1 T PO  D 03/02/17   [provider]  promethazine-dextromethorphan (PROMETHAZINE-DM) 6.25-15 MG/5ML syrup Take 5 mLs by mouth 3 (three) times daily as needed for cough. 09/14/23   Wallis Bamberg, PA-C    Family History Family History  Problem Relation Age of Onset   Heart disease Mother        died at 18   Hypertension Mother        died at 68   Diabetes Father    Diabetes Paternal Uncle    Diabetes Paternal Grandfather     Social History Social History   Tobacco Use   Smoking status: Former    Current packs/day: 0.00    Types: Cigarettes  Quit date: 09/20/2015    Years since quitting: 8.0   Smokeless tobacco: Never  Vaping Use   Vaping status: Never Used  Substance Use Topics   Alcohol use: No   Drug use: Not Currently    Types: Marijuana     Allergies   Patient has no known allergies.   Review of Systems Review of Systems  HENT:  Positive for congestion.   Respiratory:  Positive for cough and shortness of breath.      Physical Exam Triage Vital Signs ED Triage Vitals  Encounter Vitals Group     BP 09/20/23 1319 136/85     Systolic BP Percentile --      Diastolic BP Percentile --      Pulse Rate 09/20/23 1319 71     Resp 09/20/23 1319 18     Temp 09/20/23 1319 98.9 F (37.2 C)     Temp Source 09/20/23 1319 Oral     SpO2 09/20/23 1319 95 %     Weight --      Height --      Head Circumference --      Peak Flow --      Pain Score 09/20/23 1317 0     Pain Loc --      Pain Education --      Exclude from Growth Chart --    No data found.  Updated Vital Signs BP 136/85 (BP Location: Right Wrist)    Pulse 71   Temp 98.9 F (37.2 C) (Oral)   Resp 18   LMP 09/09/2023 (Exact Date)   SpO2 95%   Breastfeeding No   Visual Acuity Right Eye Distance:   Left Eye Distance:   Bilateral Distance:    Right Eye Near:   Left Eye Near:    Bilateral Near:     Physical Exam Vitals and nursing note reviewed.  Constitutional:      General: She is not in acute distress.    Appearance: She is well-developed. She is not ill-appearing.  HENT:     Head: Normocephalic and atraumatic.     Right Ear: Tympanic membrane and ear canal normal.     Left Ear: Tympanic membrane and ear canal normal.     Nose: Congestion present.     Mouth/Throat:     Mouth: Mucous membranes are moist.     Pharynx: Oropharynx is clear. Uvula midline. No posterior oropharyngeal erythema.     Tonsils: No tonsillar exudate or tonsillar abscesses.  Eyes:     Conjunctiva/sclera: Conjunctivae normal.     Pupils: Pupils are equal, round, and reactive to light.  Cardiovascular:     Rate and Rhythm: Normal rate and regular rhythm.     Heart sounds: Normal heart sounds.  Pulmonary:     Effort: Pulmonary effort is normal.     Breath sounds: Wheezing present.     Comments: Mild expiratory wheeze bilateral bases Musculoskeletal:     Cervical back: Normal range of motion and neck supple.  Lymphadenopathy:     Cervical: No cervical adenopathy.  Skin:    General: Skin is warm and dry.  Neurological:     General: No focal deficit present.     Mental Status: She is alert and oriented to person, place, and time.  Psychiatric:        Mood and Affect: Mood normal.        Behavior: Behavior normal.      UC Treatments / Results  Labs (  all labs ordered are listed, but only abnormal results are displayed) Labs Reviewed - No data to display  EKG   Radiology No results found.  Procedures Procedures (including critical care time)  Medications Ordered in UC Medications - No data to display  Initial Impression /  Assessment and Plan / UC Course  I have reviewed the triage vital signs and the nursing notes.  Pertinent labs & imaging results that were available during my care of the patient were reviewed by me and considered in my medical decision making (see chart for details).     Reviewed exam and symptoms with patient.  No red flags.  Patient declined nebulizer in clinic for her wheezing.  Advised to continue her albuterol inhaler.  Will do another 5-day course of prednisone.  Provisional prescription for Zithromax provided with instruction not to take and the symptoms do not improve or worsen over the next 2 to 3 days and patient verbalized understanding.  Instructed PCP follow-up 2 to 3 days for recheck.  ER precautions reviewed and patient verbalized understanding. Final Clinical Impressions(s) / UC Diagnoses   Final diagnoses:  Mild intermittent asthma with acute exacerbation  Acute bronchitis, unspecified organism     Discharge Instructions      Continue your albuterol inhaler as needed.  Will do another 5-day course of prednisone.  Continue Mucinex and stay hydrated to help thin out the mucus.  Provisional prescription for Zithromax has been provided.  This is an antibiotic and please do not take unless your symptoms do not improve or worsen over the next couple of days.  Lots of fluids and rest.  Follow-up with your PCP in 2 to 3 days for recheck.  Please go to the ER for any worsening symptoms.  Hope you feel better soon!    ED Prescriptions     Medication Sig Dispense Auth. Provider   azithromycin (ZITHROMAX) 250 MG tablet Take 1 tablet (250 mg total) by mouth daily. Take first 2 tablets together, then 1 every day until finished. 6 tablet Radford Pax, NP   predniSONE (DELTASONE) 20 MG tablet Take 2 tablets (40 mg total) by mouth daily with breakfast for 5 days. 10 tablet Radford Pax, NP      PDMP not reviewed this encounter.   Radford Pax, NP 09/20/23 1346

## 2023-09-20 NOTE — Discharge Instructions (Addendum)
 Continue your albuterol inhaler as needed.  Will do another 5-day course of prednisone.  Continue Mucinex and stay hydrated to help thin out the mucus.  Provisional prescription for Zithromax has been provided.  This is an antibiotic and please do not take unless your symptoms do not improve or worsen over the next couple of days.  Lots of fluids and rest.  Follow-up with your PCP in 2 to 3 days for recheck.  Please go to the ER for any worsening symptoms.  Hope you feel better soon!
